# Patient Record
Sex: Female | Born: 1955 | Race: White | Hispanic: No | Marital: Married | Smoking: Never smoker
Health system: Southern US, Community
[De-identification: ages and names within clinical notes are randomized; demographics above are authoritative.]

## PROBLEM LIST (undated history)

## (undated) DIAGNOSIS — E7800 Pure hypercholesterolemia, unspecified: Secondary | ICD-10-CM

## (undated) DIAGNOSIS — R102 Pelvic and perineal pain unspecified side: Secondary | ICD-10-CM

## (undated) DIAGNOSIS — Q513 Bicornate uterus: Secondary | ICD-10-CM

## (undated) DIAGNOSIS — K219 Gastro-esophageal reflux disease without esophagitis: Secondary | ICD-10-CM

## (undated) DIAGNOSIS — T1491XA Suicide attempt, initial encounter: Secondary | ICD-10-CM

## (undated) HISTORY — DX: Suicide attempt, initial encounter: T14.91XA

## (undated) HISTORY — DX: Pelvic and perineal pain unspecified side: R10.20

## (undated) HISTORY — DX: Bicornate uterus: Q51.3

## (undated) HISTORY — DX: Gastro-esophageal reflux disease without esophagitis: K21.9

## (undated) HISTORY — DX: Pure hypercholesterolemia, unspecified: E78.00

## (undated) HISTORY — DX: Pelvic and perineal pain: R10.2

---

## 2000-08-30 ENCOUNTER — Other Ambulatory Visit: Admission: RE | Admit: 2000-08-30 | Discharge: 2000-08-30 | Payer: Self-pay | Admitting: Obstetrics and Gynecology

## 2001-01-01 ENCOUNTER — Inpatient Hospital Stay (HOSPITAL_COMMUNITY): Admission: AD | Admit: 2001-01-01 | Discharge: 2001-01-01 | Payer: Self-pay | Admitting: Obstetrics and Gynecology

## 2001-01-02 ENCOUNTER — Inpatient Hospital Stay (HOSPITAL_COMMUNITY): Admission: AD | Admit: 2001-01-02 | Discharge: 2001-01-02 | Payer: Self-pay | Admitting: Obstetrics and Gynecology

## 2001-01-21 ENCOUNTER — Encounter (INDEPENDENT_AMBULATORY_CARE_PROVIDER_SITE_OTHER): Payer: Self-pay | Admitting: Specialist

## 2001-01-21 ENCOUNTER — Inpatient Hospital Stay (HOSPITAL_COMMUNITY): Admission: AD | Admit: 2001-01-21 | Discharge: 2001-01-25 | Payer: Self-pay | Admitting: Obstetrics and Gynecology

## 2001-01-21 ENCOUNTER — Encounter: Payer: Self-pay | Admitting: Obstetrics and Gynecology

## 2001-01-26 ENCOUNTER — Encounter: Admission: RE | Admit: 2001-01-26 | Discharge: 2001-02-25 | Payer: Self-pay | Admitting: Obstetrics and Gynecology

## 2001-03-08 ENCOUNTER — Other Ambulatory Visit: Admission: RE | Admit: 2001-03-08 | Discharge: 2001-03-08 | Payer: Self-pay | Admitting: Obstetrics and Gynecology

## 2002-03-13 ENCOUNTER — Other Ambulatory Visit: Admission: RE | Admit: 2002-03-13 | Discharge: 2002-03-13 | Payer: Self-pay | Admitting: Obstetrics and Gynecology

## 2003-02-21 ENCOUNTER — Other Ambulatory Visit: Admission: RE | Admit: 2003-02-21 | Discharge: 2003-02-21 | Payer: Self-pay | Admitting: Obstetrics and Gynecology

## 2004-09-29 ENCOUNTER — Encounter (INDEPENDENT_AMBULATORY_CARE_PROVIDER_SITE_OTHER): Payer: Self-pay | Admitting: Specialist

## 2004-09-29 ENCOUNTER — Ambulatory Visit (HOSPITAL_COMMUNITY): Admission: RE | Admit: 2004-09-29 | Discharge: 2004-09-29 | Payer: Self-pay | Admitting: Gastroenterology

## 2005-10-26 ENCOUNTER — Other Ambulatory Visit: Admission: RE | Admit: 2005-10-26 | Discharge: 2005-10-26 | Payer: Self-pay | Admitting: Obstetrics and Gynecology

## 2008-01-20 ENCOUNTER — Inpatient Hospital Stay (HOSPITAL_COMMUNITY): Admission: EM | Admit: 2008-01-20 | Discharge: 2008-01-22 | Payer: Self-pay | Admitting: Emergency Medicine

## 2008-02-01 ENCOUNTER — Ambulatory Visit (HOSPITAL_COMMUNITY): Admission: RE | Admit: 2008-02-01 | Discharge: 2008-02-01 | Payer: Self-pay | Admitting: Orthopedic Surgery

## 2008-04-01 ENCOUNTER — Encounter: Admission: RE | Admit: 2008-04-01 | Discharge: 2008-04-01 | Payer: Self-pay | Admitting: Orthopedic Surgery

## 2008-05-21 ENCOUNTER — Ambulatory Visit: Payer: Self-pay | Admitting: Internal Medicine
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
## 2010-10-05 NOTE — Procedures (Signed)
[facility]                              EXERCISE TREADMILL   NAME:Holden, Jackie Lee                    MRN:          [id number]  DATE:05/21/2008                            DOB:          10-24-1955    INTRODUCTION:  The patient is a 55 year old with a history of several  cardiac risk factors and atypical chest pain, who is referred for  evaluation.  She is now referred for exercise treadmill testing.   PROCEDURE:  After informed consent was obtained, the patient was prepped  in the usual manner.  She subsequently walked on a Bruce protocol for a  total of 9 minutes.  Her baseline ECG was normal.  She reached a peak  heart rate of 164 beats per minute.  Her peak blood pressure reached  211/80.  The test was stopped secondary to fatigue and MD discretion.  She had no chest pain or shortness of breath.  She had her EKG segments  demonstrating no evidence of inducible ischemia with no ST-T wave  changes and no inducible arrhythmias.  She was allowed to recover in the  usual manner with normalization of her blood pressure.   COMPLICATIONS:  There were no immediate procedure complications.   RESULTS:  Clinically and electrically negative exercise treadmill test  in a patient with atypical chest pain and several cardiac risk factors  for coronary artery disease.     Doylene Canning. Ladona Ridgel, MD  Electronically Signed    GWT/MedQ  DD: 05/21/2008  DT: 05/22/2008  Job #: [id number]   cc:   Payton Spark
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
## 2010-10-08 NOTE — Op Note (Signed)
NAMEANTONIQUE, Jackie Lee             ACCOUNT NO.:  [id number]   MEDICAL RECORD NO.:  [id number]          PATIENT TYPE:  AMB   LOCATION:  ENDO                         FACILITY:  [facility]   PHYSICIAN:  Anselmo Rod, M.D.  DATE OF BIRTH:  [birth year]   DATE OF PROCEDURE:  09/29/2004  DATE OF DISCHARGE:                                 OPERATIVE REPORT   PROCEDURE PERFORMED:  Esophagogastroduodenoscopy.   ENDOSCOPIST:  Anselmo Rod, M.D.   INSTRUMENT USED:  Olympus video panendoscope.   INDICATIONS FOR PROCEDURE:  A 55 year old white female with a history of  epigastric pain and black stool undergoing EGD to rule out peptic ulcer  disease, esophagitis, gastritis, etc.   PREPROCEDURE PREPARATION:  Informed consent was procured from the patient.  The patient fasted for eight hours prior to the procedure.   PREPROCEDURE PHYSICAL EXAMINATION:  VITAL SIGNS:  Stable.  NECK:  Supple.  CHEST:  Clear to auscultation.  CARDIOVASCULAR:  S1 and S2 regular.  ABDOMEN:  Soft with normal bowel sounds.   DESCRIPTION OF PROCEDURE:  The patient was placed in left lateral decubitus  position, sedated with 50 mg of Demerol and 5  mg of Versed in slow  incremental doses.  Once the patient was adequately sedated and maintained  on low flow oxygen and continuous cardiac monitoring, the Olympus video  panendoscope was advanced through the mouthpiece over the tongue and into  the esophagus under direct vision.  Mild distal esophagitis was noted.  No  ulcers, erosions, masses or polyps were identified.  The entire gastric  mucosa appeared healthy and so did the proximal small-bowel.  Retroflexion  of the high cardia revealed no abnormalities.  The patient tolerated the  procedure well without complications.  The proximal esophagus appeared  normal as well.   IMPRESSION:  Normal EGD except for mild distal esophagitis.   RECOMMENDATIONS:  1.  Continue PPIs.  2.  Avoid all nonsteroidals.  3.  Proceed with  a colonoscopy at this time.  Further recommendations will      be made after the colonoscopy has been done.      JNM/MEDQ  D:  09/30/2004  T:  09/30/2004  Job:  [id number]   cc:   Gabriel Earing, M.D.  [address]  [place]  [place] [address]  Fax: [phone]

## 2010-10-08 NOTE — Op Note (Signed)
[name], Jackie Lee             ACCOUNT NO.:  [id number]   MEDICAL RECORD NO.:  [id number]          PATIENT TYPE:  AMB   LOCATION:  ENDO                         FACILITY:  [facility]   PHYSICIAN:  Anselmo Rod, M.D.  DATE OF BIRTH:  June 22, 1955   DATE OF PROCEDURE:  09/30/2004  DATE OF DISCHARGE:                                 OPERATIVE REPORT   PROCEDURE PERFORMED:  Colonoscopy with cold biopsies times four.   ENDOSCOPIST:  Charna Elizabeth, M.D.   INSTRUMENT USED:  Olympus video colonoscope.   INDICATIONS FOR PROCEDURE:  The patient is a 55 year old white female  undergoing screening colonoscopy.  The patient has a history of occasional  rectal bleeding and change in bowel habits.  She claims she has noticed some  black tarry stools.  Rule out colonic polyps, masses, etc.   PREPROCEDURE PREPARATION:  Informed consent was procured from the patient.  The patient was fasted for eight hours prior to the procedure and prepped  with a bottle of magnesium citrate and a gallon of GoLYTELY the night prior  to the procedure.  The risks and benefits of the procedure including a 10%  miss rate for polyps or cancers was discussed with the patient as well.   PREPROCEDURE PHYSICAL:  The patient had stable vital signs.  Neck supple.  Chest clear to auscultation.  S1 and S2 regular.  Abdomen soft with normal  bowel sounds.   DESCRIPTION OF PROCEDURE:  The patient was placed in left lateral decubitus  position and sedated with an additional 2.5 mg of Demerol in slow  incremental doses.  Once the patient was adequately sedated and maintained  on low flow oxygen and continuous cardiac monitoring, the Olympus video  colonoscope was advanced from the rectum to the cecum.  There was a  significant amount of residual stool in the colon and multiple washes were  done.  A few sigmoid diverticula were noted.  Internal hemorrhoids were seen  on retroflexion. A small polyp was biopsied from 70 cm (cold  biopsies times  four).  The rest of the exam was unremarkable.  Multiple washes were done.  Small lesions could have been missed.  The patient tolerated the procedure  well without complication.  The procedure was complete up to the cecum.   IMPRESSION:  1.  Small sessile polyp was biopsied from 70 cm.  2.  Few sigmoid diverticula.  3.  Internal hemorrhoids.   RECOMMENDATIONS:  1.  Await pathology results.  2.  Avoid all nonsteroidals including aspirin for the next two weeks.  3.  Outpatient followup in the next two weeks for further recommendations.      JNM/MEDQ  D:  09/30/2004  T:  09/30/2004  Job:  [id number]   cc:   Gabriel Earing, M.D.  [address]  [place]  [place] [address]  Fax: [phone]

## 2011-10-05 DIAGNOSIS — E7800 Pure hypercholesterolemia, unspecified: Secondary | ICD-10-CM | POA: Insufficient documentation

## 2011-10-05 DIAGNOSIS — T1491XA Suicide attempt, initial encounter: Secondary | ICD-10-CM | POA: Insufficient documentation

## 2011-10-05 DIAGNOSIS — R102 Pelvic and perineal pain: Secondary | ICD-10-CM | POA: Insufficient documentation

## 2011-10-05 DIAGNOSIS — Q513 Bicornate uterus: Secondary | ICD-10-CM | POA: Insufficient documentation

## 2011-10-14 ENCOUNTER — Ambulatory Visit: Payer: Self-pay | Admitting: Obstetrics and Gynecology

## 2011-11-22 ENCOUNTER — Other Ambulatory Visit (HOSPITAL_COMMUNITY)
Admission: RE | Admit: 2011-11-22 | Discharge: 2011-11-22 | Disposition: A | Discharge status: Home / Self Care | Payer: 59 | Source: Ambulatory Visit | Attending: Family Medicine | Admitting: Family Medicine

## 2011-11-22 ENCOUNTER — Other Ambulatory Visit: Payer: Self-pay | Admitting: Family Medicine

## 2011-11-22 DIAGNOSIS — Z124 Encounter for screening for malignant neoplasm of cervix: Secondary | ICD-10-CM | POA: Insufficient documentation

## 2013-12-02 ENCOUNTER — Other Ambulatory Visit: Payer: Self-pay | Admitting: Family Medicine

## 2013-12-02 DIAGNOSIS — M7989 Other specified soft tissue disorders: Secondary | ICD-10-CM

## 2013-12-04 ENCOUNTER — Ambulatory Visit
Admission: RE | Admit: 2013-12-04 | Discharge: 2013-12-04 | Disposition: A | Discharge status: Home / Self Care | Payer: 59 | Source: Ambulatory Visit | Attending: Family Medicine | Admitting: Family Medicine

## 2013-12-04 DIAGNOSIS — M7989 Other specified soft tissue disorders: Secondary | ICD-10-CM

## 2013-12-04 MED ORDER — IOHEXOL 300 MG/ML  SOLN
100.0000 mL | Freq: Once | INTRAMUSCULAR | Status: AC | PRN
  Administered 2013-12-04: 100 mL via INTRAVENOUS

## 2013-12-30 ENCOUNTER — Ambulatory Visit (INDEPENDENT_AMBULATORY_CARE_PROVIDER_SITE_OTHER): Payer: 59 | Admitting: Surgery

## 2014-01-31 ENCOUNTER — Ambulatory Visit (INDEPENDENT_AMBULATORY_CARE_PROVIDER_SITE_OTHER): Payer: 59 | Admitting: Surgery

## 2014-12-04 ENCOUNTER — Other Ambulatory Visit (HOSPITAL_COMMUNITY)
Admission: RE | Admit: 2014-12-04 | Discharge: 2014-12-04 | Disposition: A | Discharge status: Home / Self Care | Payer: 59 | Source: Ambulatory Visit | Attending: Family Medicine | Admitting: Family Medicine

## 2014-12-04 ENCOUNTER — Other Ambulatory Visit: Payer: Self-pay | Admitting: Family Medicine

## 2014-12-04 DIAGNOSIS — Z124 Encounter for screening for malignant neoplasm of cervix: Secondary | ICD-10-CM | POA: Diagnosis present

## 2014-12-08 LAB — CYTOLOGY - PAP

## 2015-03-17 IMAGING — CT CT ABD-PELV W/ CM
2 of 5 series · 17 of 46 positions shown, 19 images · IV contrast (READICAT/WATER & [ID] OMNI 300)
Comparison: None.

CLINICAL DATA: Soft tissue mass in the left abdominal wall

EXAM:
CT ABDOMEN AND PELVIS WITH CONTRAST
TECHNIQUE: Multidetector CT imaging of the abdomen and pelvis was performed
using the standard protocol following bolus administration of
intravenous contrast.
CONTRAST:  100mL OMNIPAQUE IOHEXOL 300 MG/ML  SOLN

[Series 100: abd/pelvis with · axial · 0.70mm/px · z∈[-347,+33]mm · 14 of 86 slices shown, 16 images]
[im 5/86  soft-tissue]
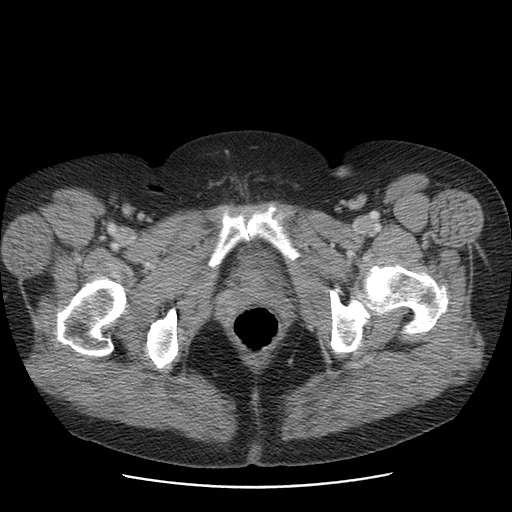
[im 5/86  bone]
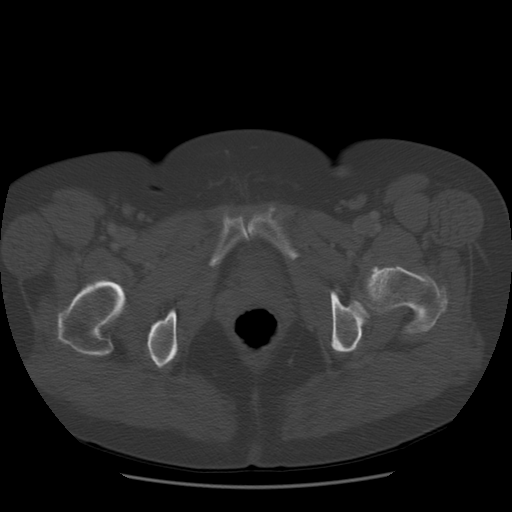
[im 9/86  soft-tissue]
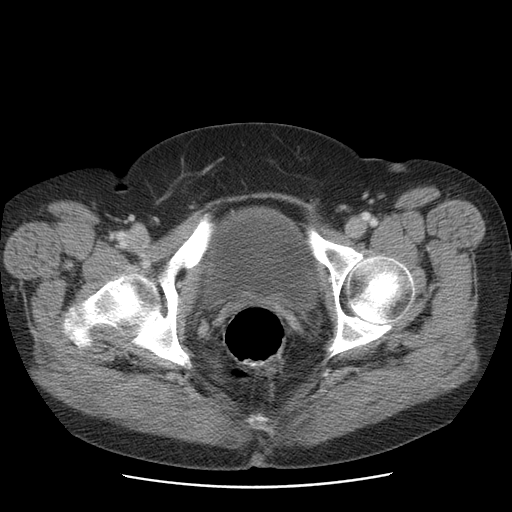
[im 18/86  soft-tissue]
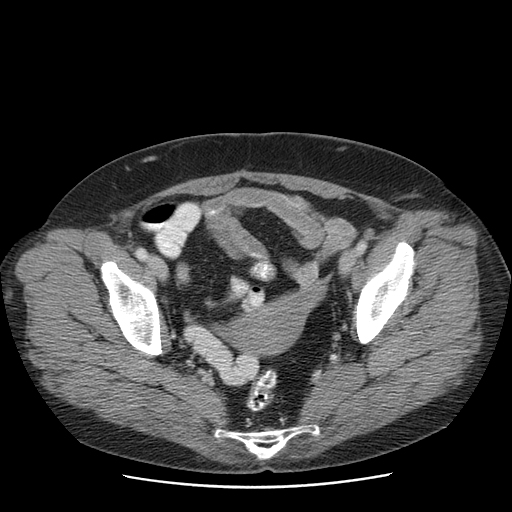
[im 23/86  soft-tissue]
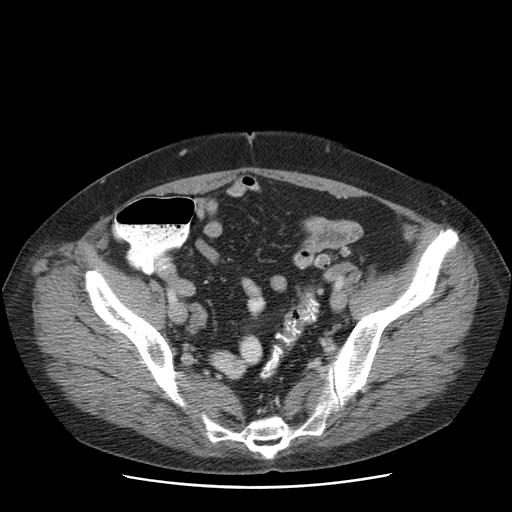
[im 27/86  soft-tissue]
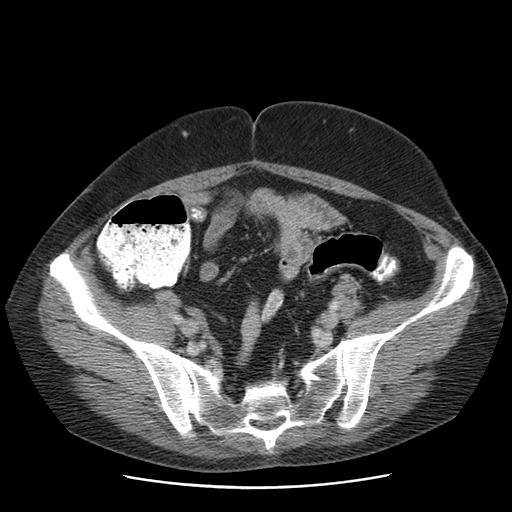
[im 36/86  soft-tissue]
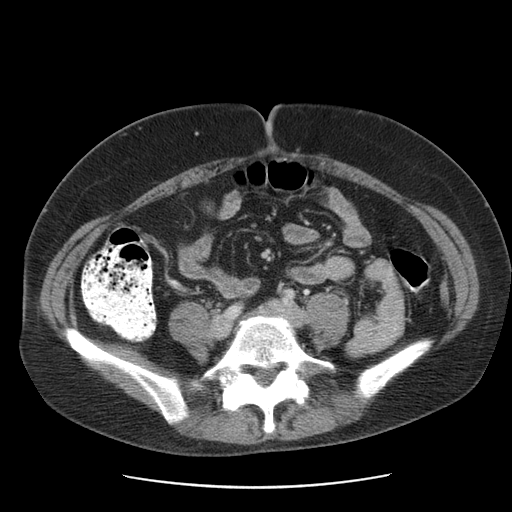
[im 41/86  soft-tissue]
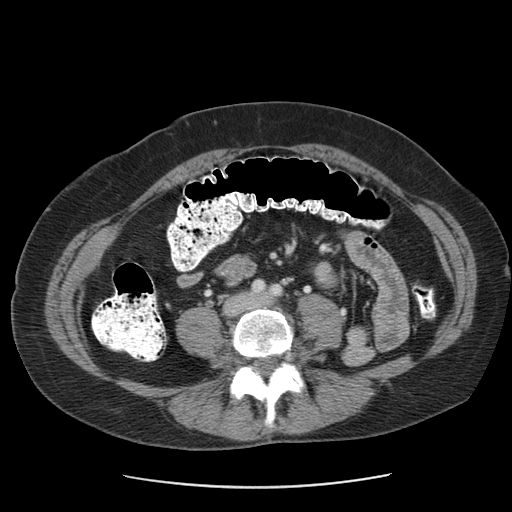
[im 45/86  soft-tissue]
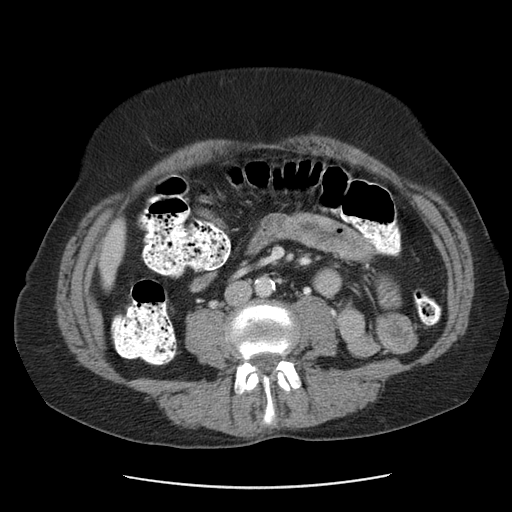
[im 50/86  soft-tissue]
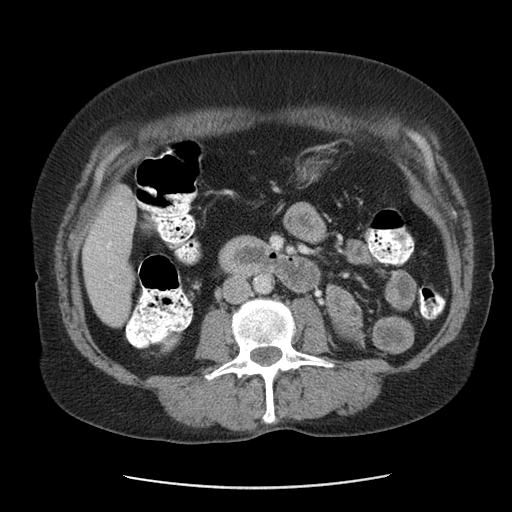
[im 50/86  bone]
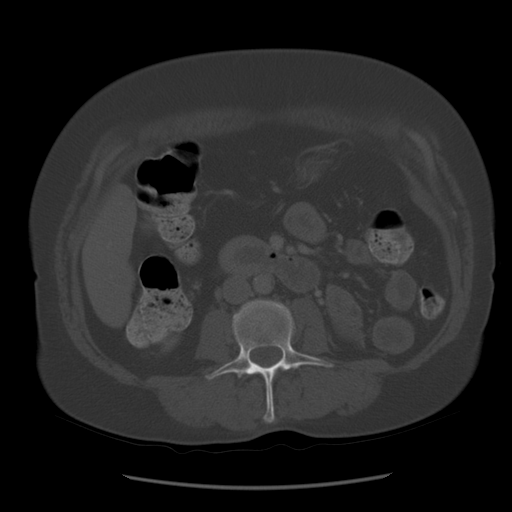
[im 59/86  soft-tissue]
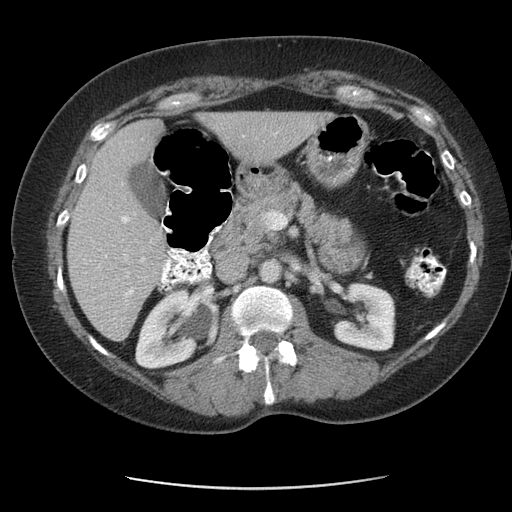
[im 63/86  soft-tissue]
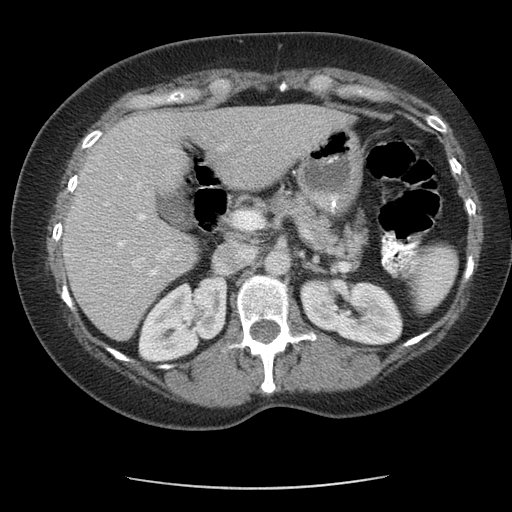
[im 68/86  soft-tissue]
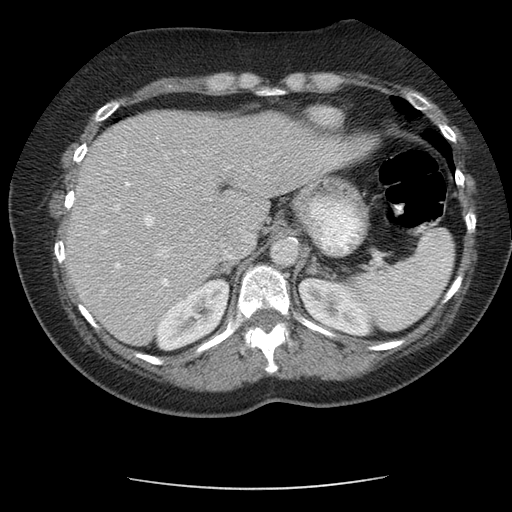
[im 77/86  soft-tissue]
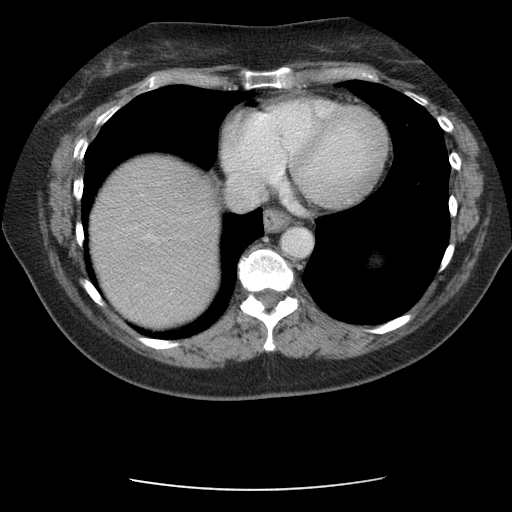
[im 81/86  soft-tissue]
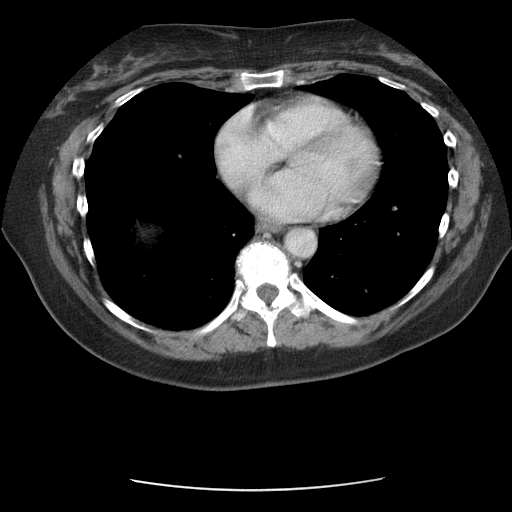

[Series 103: cor · coronal · 0.98mm/px · 3 of 147 slices shown]
[im 49/147  soft-tissue]
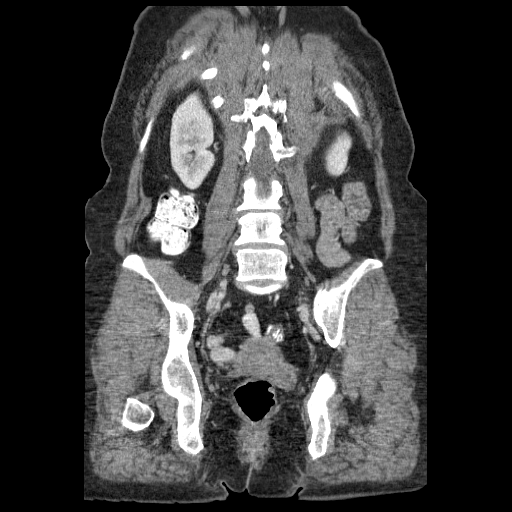
[im 65/147  soft-tissue]
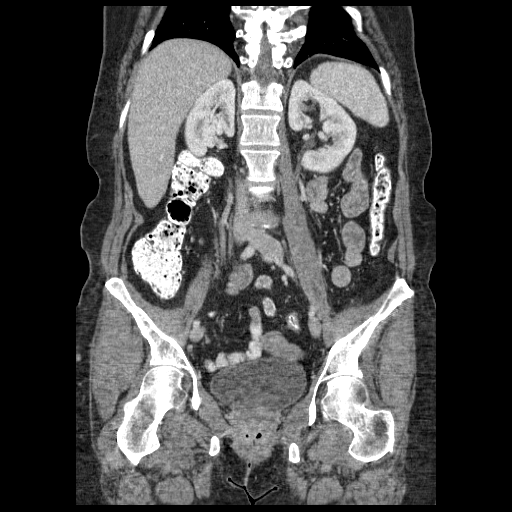
[im 82/147  soft-tissue]
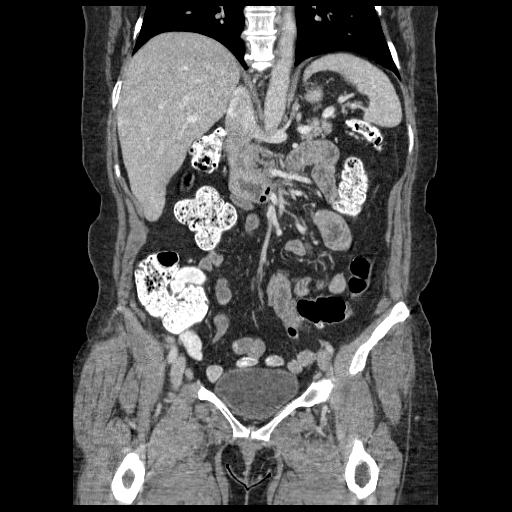

[17 of 46 positions shown; findings below may reference images not displayed]

FINDINGS: The lung bases are free of acute infiltrate or sizable effusion.

The liver, gallbladder, spleen, adrenal glands and pancreas are
within normal limits. The kidneys are well visualized bilaterally
and demonstrate no renal calculi or obstructive changes. A prominent
right extrarenal pelvis is noted.

The appendix is well visualized and within normal limits. Scattered
diverticular change of the colon is seen without diverticulitis. The
bladder is partially distended. No pelvic mass lesion or sidewall
abnormality is seen. In the area of clinical concern, mild
prominence of the subcutaneous fat is noted consistent with a
lipoma. No abnormal soft tissue density is identified. The
subcutaneous adipose tissue is otherwise within normal limits. The
osseous structures are within normal limits for the patient's given
age.
IMPRESSION: Changes consistent with a subcutaneous lipoma in the lateral
abdominal wall in the area of clinical concern. No soft tissue
component is identified in this is consistent with a simple lipoma.

Chronic changes without acute abnormality.

## 2016-01-18 ENCOUNTER — Other Ambulatory Visit: Payer: Self-pay | Admitting: Surgery

## 2016-01-18 DIAGNOSIS — Z22322 Carrier or suspected carrier of Methicillin resistant Staphylococcus aureus: Secondary | ICD-10-CM

## 2016-09-02 DIAGNOSIS — E782 Mixed hyperlipidemia: Secondary | ICD-10-CM | POA: Diagnosis not present

## 2016-09-02 DIAGNOSIS — R7303 Prediabetes: Secondary | ICD-10-CM | POA: Diagnosis not present

## 2016-09-02 DIAGNOSIS — E559 Vitamin D deficiency, unspecified: Secondary | ICD-10-CM | POA: Diagnosis not present

## 2016-09-02 DIAGNOSIS — R5383 Other fatigue: Secondary | ICD-10-CM | POA: Diagnosis not present

## 2016-09-06 DIAGNOSIS — M8589 Other specified disorders of bone density and structure, multiple sites: Secondary | ICD-10-CM | POA: Diagnosis not present

## 2016-09-06 DIAGNOSIS — Z803 Family history of malignant neoplasm of breast: Secondary | ICD-10-CM | POA: Diagnosis not present

## 2016-09-06 DIAGNOSIS — Z1231 Encounter for screening mammogram for malignant neoplasm of breast: Secondary | ICD-10-CM | POA: Diagnosis not present

## 2016-09-12 DIAGNOSIS — L308 Other specified dermatitis: Secondary | ICD-10-CM | POA: Diagnosis not present

## 2016-09-12 DIAGNOSIS — L738 Other specified follicular disorders: Secondary | ICD-10-CM | POA: Diagnosis not present

## 2016-09-20 DIAGNOSIS — E782 Mixed hyperlipidemia: Secondary | ICD-10-CM | POA: Diagnosis not present

## 2016-09-20 DIAGNOSIS — R7303 Prediabetes: Secondary | ICD-10-CM | POA: Diagnosis not present

## 2016-09-20 DIAGNOSIS — E559 Vitamin D deficiency, unspecified: Secondary | ICD-10-CM | POA: Diagnosis not present

## 2016-11-07 DIAGNOSIS — E559 Vitamin D deficiency, unspecified: Secondary | ICD-10-CM | POA: Diagnosis not present

## 2016-11-08 DIAGNOSIS — R7303 Prediabetes: Secondary | ICD-10-CM | POA: Diagnosis not present

## 2016-11-08 DIAGNOSIS — E559 Vitamin D deficiency, unspecified: Secondary | ICD-10-CM | POA: Diagnosis not present

## 2016-11-08 DIAGNOSIS — E782 Mixed hyperlipidemia: Secondary | ICD-10-CM | POA: Diagnosis not present

## 2016-12-06 DIAGNOSIS — E559 Vitamin D deficiency, unspecified: Secondary | ICD-10-CM | POA: Diagnosis not present

## 2016-12-06 DIAGNOSIS — R7303 Prediabetes: Secondary | ICD-10-CM | POA: Diagnosis not present

## 2016-12-06 DIAGNOSIS — E782 Mixed hyperlipidemia: Secondary | ICD-10-CM | POA: Diagnosis not present

## 2016-12-09 DIAGNOSIS — Z0000 Encounter for general adult medical examination without abnormal findings: Secondary | ICD-10-CM | POA: Diagnosis not present

## 2016-12-30 DIAGNOSIS — H40013 Open angle with borderline findings, low risk, bilateral: Secondary | ICD-10-CM | POA: Diagnosis not present

## 2017-01-06 DIAGNOSIS — Z91038 Other insect allergy status: Secondary | ICD-10-CM | POA: Diagnosis not present

## 2017-01-06 DIAGNOSIS — N390 Urinary tract infection, site not specified: Secondary | ICD-10-CM | POA: Diagnosis not present

## 2017-01-06 DIAGNOSIS — R319 Hematuria, unspecified: Secondary | ICD-10-CM | POA: Diagnosis not present

## 2017-04-04 DIAGNOSIS — E782 Mixed hyperlipidemia: Secondary | ICD-10-CM | POA: Diagnosis not present

## 2017-04-04 DIAGNOSIS — R7303 Prediabetes: Secondary | ICD-10-CM | POA: Diagnosis not present

## 2017-04-04 DIAGNOSIS — E559 Vitamin D deficiency, unspecified: Secondary | ICD-10-CM | POA: Diagnosis not present

## 2017-04-06 DIAGNOSIS — R7303 Prediabetes: Secondary | ICD-10-CM | POA: Diagnosis not present

## 2017-04-06 DIAGNOSIS — E782 Mixed hyperlipidemia: Secondary | ICD-10-CM | POA: Diagnosis not present

## 2017-04-06 DIAGNOSIS — E559 Vitamin D deficiency, unspecified: Secondary | ICD-10-CM | POA: Diagnosis not present

## 2017-04-10 DIAGNOSIS — L218 Other seborrheic dermatitis: Secondary | ICD-10-CM | POA: Diagnosis not present

## 2017-04-10 DIAGNOSIS — D1801 Hemangioma of skin and subcutaneous tissue: Secondary | ICD-10-CM | POA: Diagnosis not present

## 2017-06-30 ENCOUNTER — Other Ambulatory Visit: Payer: Self-pay | Admitting: Family Medicine

## 2017-06-30 ENCOUNTER — Ambulatory Visit
Admission: RE | Admit: 2017-06-30 | Discharge: 2017-06-30 | Disposition: A | Discharge status: Home / Self Care | Payer: 59 | Source: Ambulatory Visit | Attending: Family Medicine | Admitting: Family Medicine

## 2017-06-30 DIAGNOSIS — R221 Localized swelling, mass and lump, neck: Secondary | ICD-10-CM

## 2017-07-04 ENCOUNTER — Other Ambulatory Visit: Payer: Self-pay | Admitting: Endocrinology

## 2017-07-04 DIAGNOSIS — E041 Nontoxic single thyroid nodule: Secondary | ICD-10-CM

## 2017-07-05 ENCOUNTER — Other Ambulatory Visit: Payer: Self-pay | Admitting: Endocrinology

## 2017-07-05 ENCOUNTER — Ambulatory Visit
Admission: RE | Admit: 2017-07-05 | Discharge: 2017-07-05 | Disposition: A | Discharge status: Home / Self Care | Payer: 59 | Source: Ambulatory Visit | Attending: Endocrinology | Admitting: Endocrinology

## 2017-07-05 ENCOUNTER — Other Ambulatory Visit (HOSPITAL_COMMUNITY)
Admission: RE | Admit: 2017-07-05 | Discharge: 2017-07-05 | Disposition: A | Discharge status: Home / Self Care | Payer: 59 | Source: Ambulatory Visit | Attending: General Surgery | Admitting: General Surgery

## 2017-07-05 DIAGNOSIS — E041 Nontoxic single thyroid nodule: Secondary | ICD-10-CM | POA: Insufficient documentation

## 2017-12-21 ENCOUNTER — Other Ambulatory Visit: Payer: Self-pay

## 2017-12-21 ENCOUNTER — Other Ambulatory Visit (HOSPITAL_COMMUNITY)
Admission: RE | Admit: 2017-12-21 | Discharge: 2017-12-21 | Disposition: A | Discharge status: Home / Self Care | Payer: 59 | Source: Ambulatory Visit | Attending: Family Medicine | Admitting: Family Medicine

## 2017-12-21 DIAGNOSIS — Z124 Encounter for screening for malignant neoplasm of cervix: Secondary | ICD-10-CM | POA: Insufficient documentation

## 2017-12-27 LAB — CYTOLOGY - PAP: Diagnosis: NEGATIVE

## 2018-03-07 ENCOUNTER — Other Ambulatory Visit: Payer: Self-pay | Admitting: Endocrinology

## 2018-03-07 DIAGNOSIS — E041 Nontoxic single thyroid nodule: Secondary | ICD-10-CM

## 2018-03-12 ENCOUNTER — Ambulatory Visit
Admission: RE | Admit: 2018-03-12 | Discharge: 2018-03-12 | Disposition: A | Discharge status: Home / Self Care | Payer: 59 | Source: Ambulatory Visit | Attending: Endocrinology | Admitting: Endocrinology

## 2018-03-12 DIAGNOSIS — E041 Nontoxic single thyroid nodule: Secondary | ICD-10-CM

## 2018-11-30 IMAGING — US US THYROID
1 series · 13 of 25 positions shown · non-contrast
Comparison: None.

CLINICAL DATA: Palpable abnormality.  Left-sided neck mass.

EXAM:
THYROID ULTRASOUND
TECHNIQUE: Ultrasound examination of the thyroid gland and adjacent soft
tissues was performed.

[Series 1: us thyroid · 0.10mm/px · 13 of 47 slices shown]
[im 1/47]
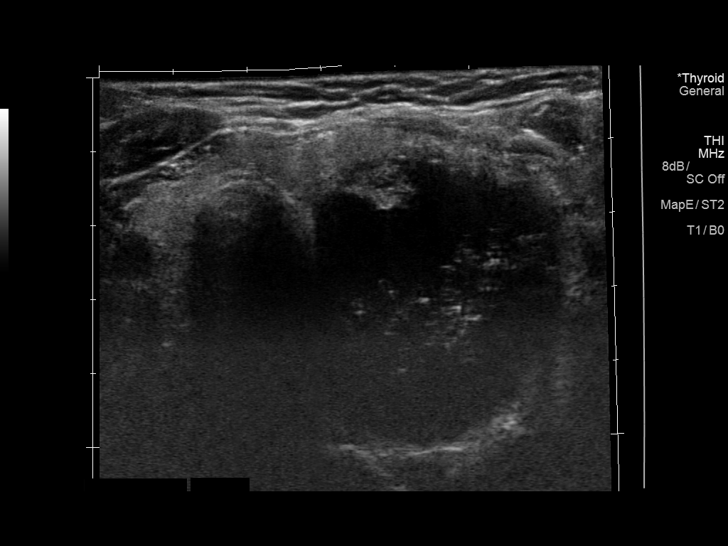
[im 4/47]
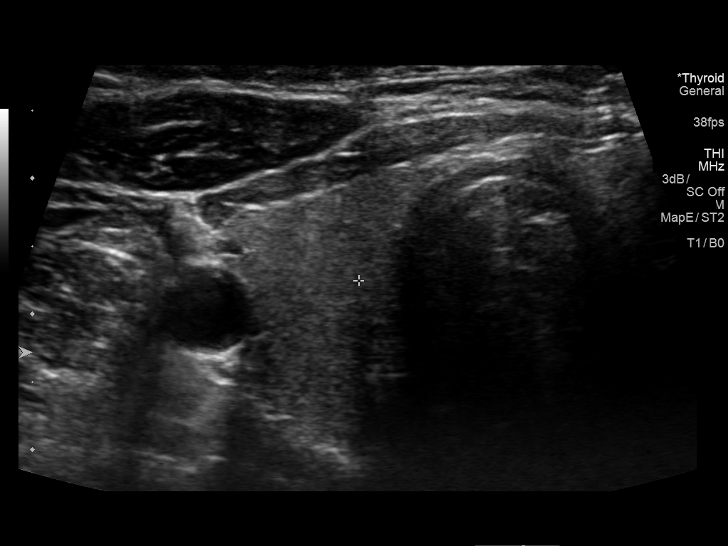
[im 8/47]
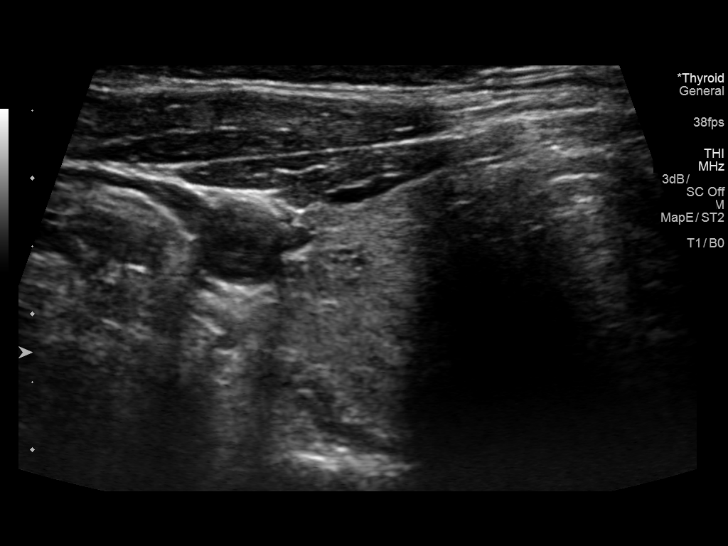
[im 12/47]
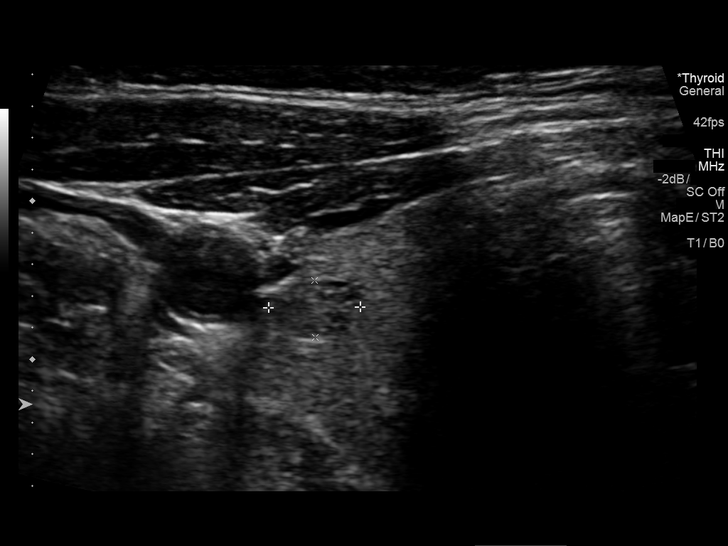
[im 16/47]
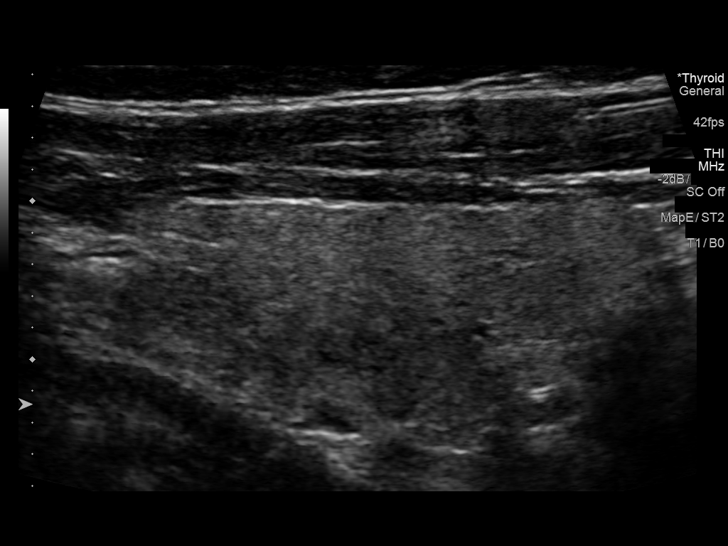
[im 20/47]
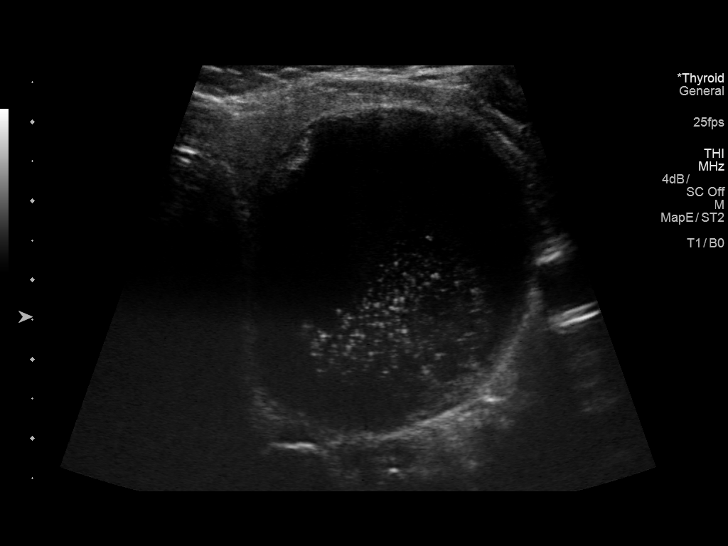
[im 24/47]
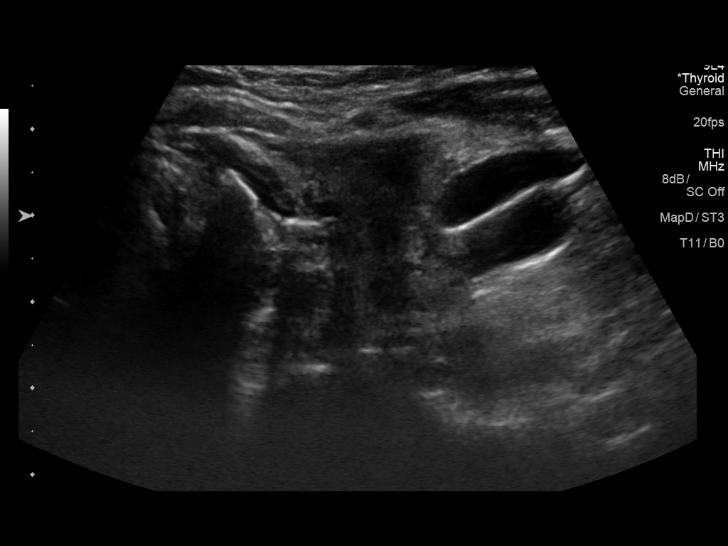
[im 27/47]
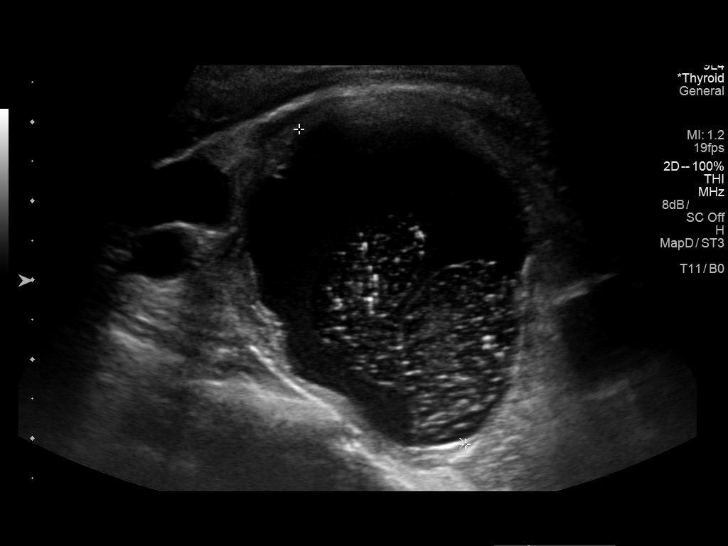
[im 31/47]
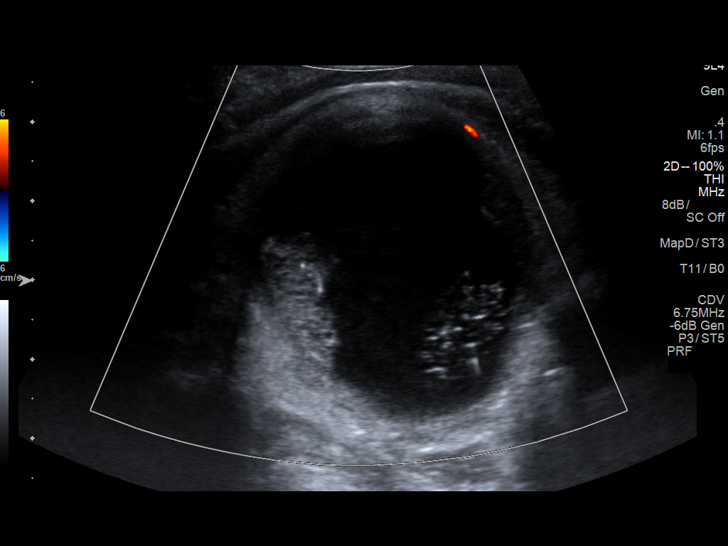
[im 35/47]
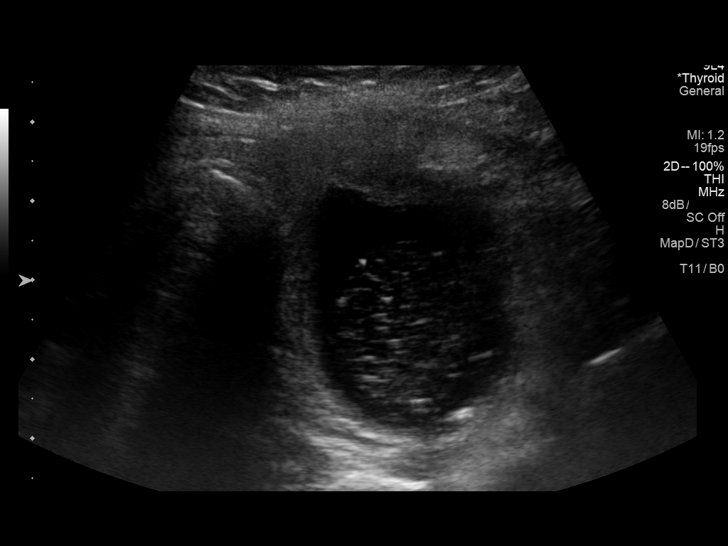
[im 39/47]
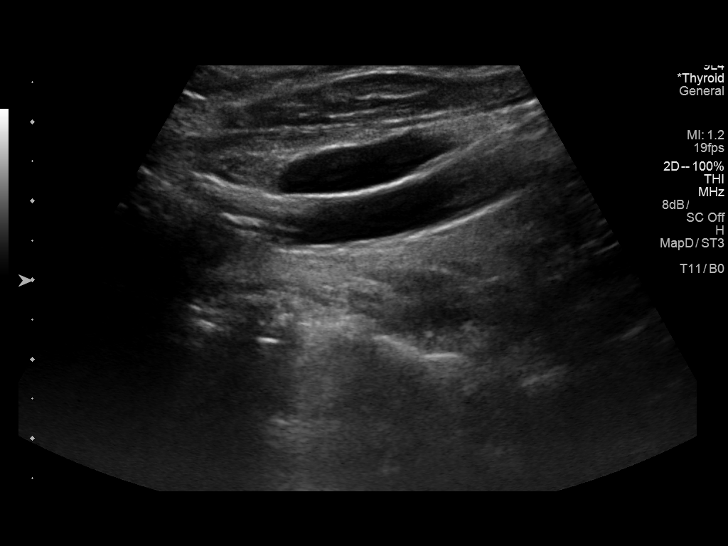
[im 43/47]
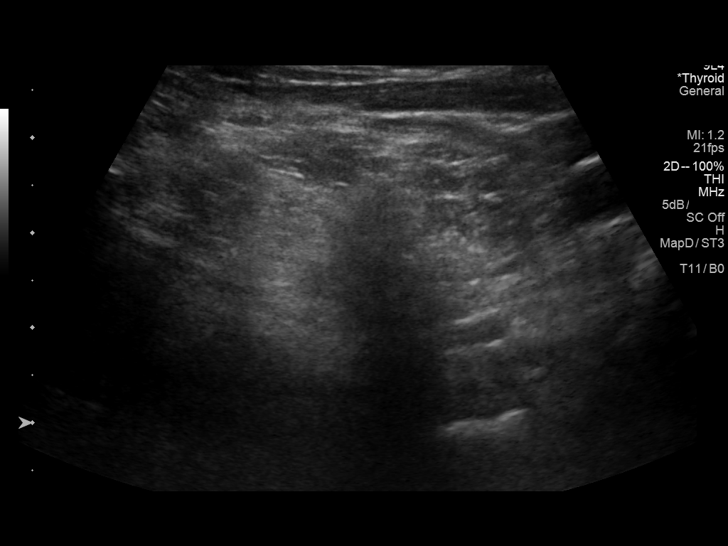
[im 47/47]
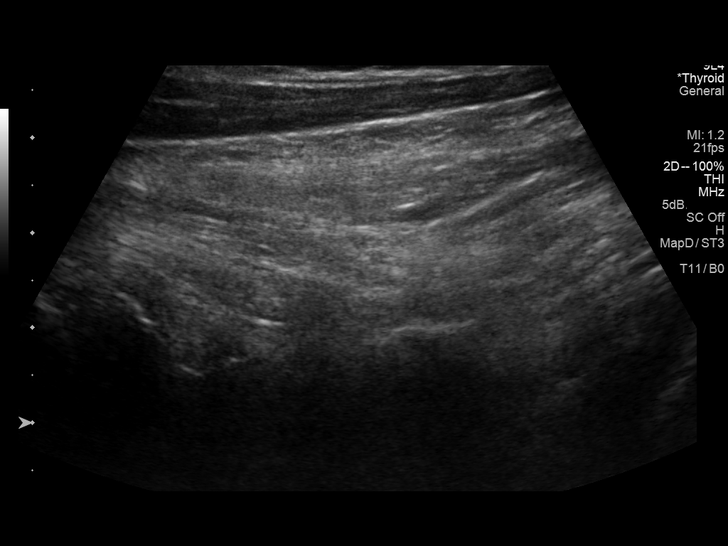

[13 of 25 positions shown; findings below may reference images not displayed]

FINDINGS: Parenchymal Echotexture: Mildly heterogenous

Isthmus: 0.4 cm

Right lobe: 4.2 x 1.8 x 1.2 cm

Left lobe: 5.3 x 4.6 x 4.0 cm

_________________________________________________________

Estimated total number of nodules >/= 1 cm: 1

Number of spongiform nodules >/=  2 cm not described below (TR1): 0

Number of mixed cystic and solid nodules >/= 1.5 cm not described
below (TR2): 0

_________________________________________________________

Nodule # 1:

Location: Right; Superior

Maximum size: 0.6 cm; Other 2 dimensions: 0.5 x 0.4 cm

Composition: solid/almost completely solid (2)

Echogenicity: isoechoic (1)

Shape: not taller-than-wide (0)

Margins: ill-defined (0)

Echogenic foci: punctate echogenic foci (3)

ACR TI-RADS total points: 6.

ACR TI-RADS risk category: TR4 (4-6 points).

ACR TI-RADS recommendations:

Given size (<0.9 cm) and appearance, this nodule does NOT meet
TI-RADS criteria for biopsy or dedicated follow-up.

_________________________________________________________

Nodule # 2:

Location: Left; Mid

Maximum size: 4.5 cm; Other 2 dimensions: 3.8 x 4.0 cm

Composition: mixed cystic and solid (1)

Echogenicity: hypoechoic (2)

Shape: taller-than-wide (3)

Margins: smooth (0)

Echogenic foci: punctate echogenic foci (3)

ACR TI-RADS total points: 9.

ACR TI-RADS risk category: TR5 (>/= 7 points).

ACR TI-RADS recommendations:

**Given size (>/= 1.0 cm) and appearance, fine needle aspiration of
this highly suspicious nodule should be considered based on TI-RADS
criteria.

_________________________________________________________

No abnormal appearing lymph nodes identified.
IMPRESSION: 1. 4.5 cm solid and cystic nodule in the left lobe with solid
portion demonstrating worrisome features including diffuse punctate
echogenic foci. Fine-needle aspiration is recommended.
2. 0.9 cm superior right thyroid nodule does not meet criteria for
fine-needle aspiration or dedicated follow-up.

The above is in keeping with the ACR TI-RADS recommendations - [HOSPITAL] 0927;[DATE].

## 2018-12-05 IMAGING — US US FNA BIOPSY THYROID 1ST LESION
1 series · 13 of 22 positions shown · non-contrast
Comparison: Ultrasound on July 01, 2017

MEDICATIONS:
1% lidocaine

COMPLICATIONS:
None immediate.

INDICATION: Indeterminate thyroid nodule

EXAM:
ULTRASOUND GUIDED FINE NEEDLE ASPIRATION OF INDETERMINATE THYROID
NODULE
TECHNIQUE: Informed written consent was obtained from the patient after a
discussion of the risks, benefits and alternatives to treatment.
Questions regarding the procedure were encouraged and answered. A
timeout was performed prior to the initiation of the procedure.

[Series 1: us fna biopsy thyroid 1st lesion · 0.09mm/px · 22 acquisitions, 13 frames shown]
[im 1/22]
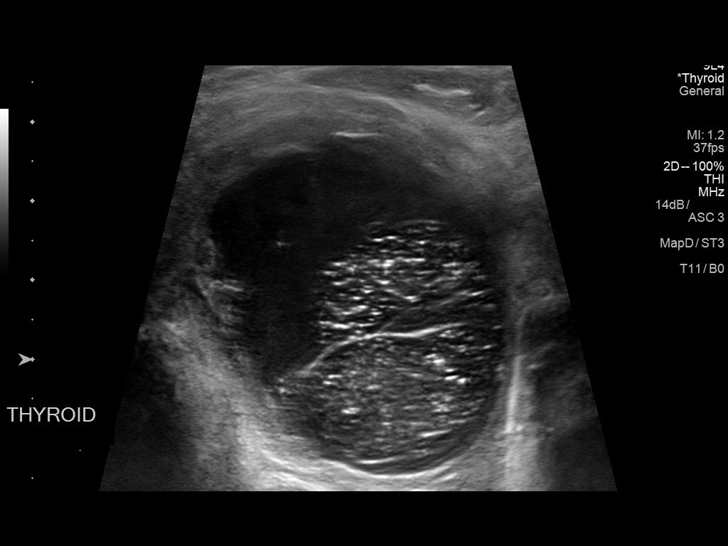
[im 3/22]
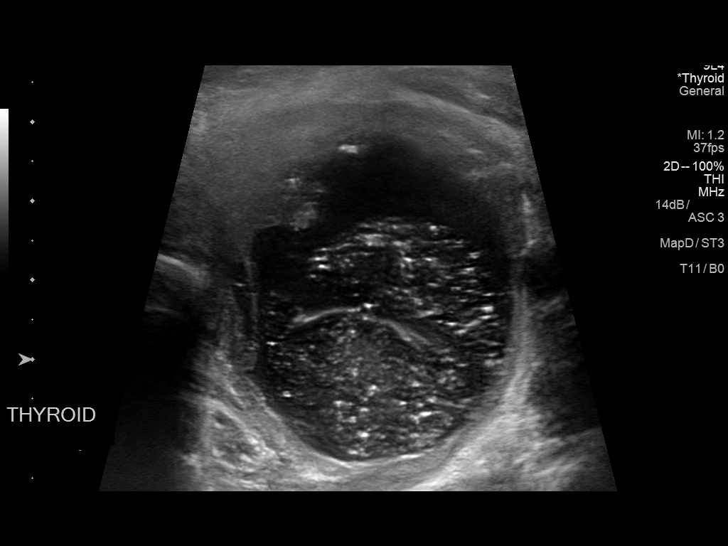
[im 5/22]
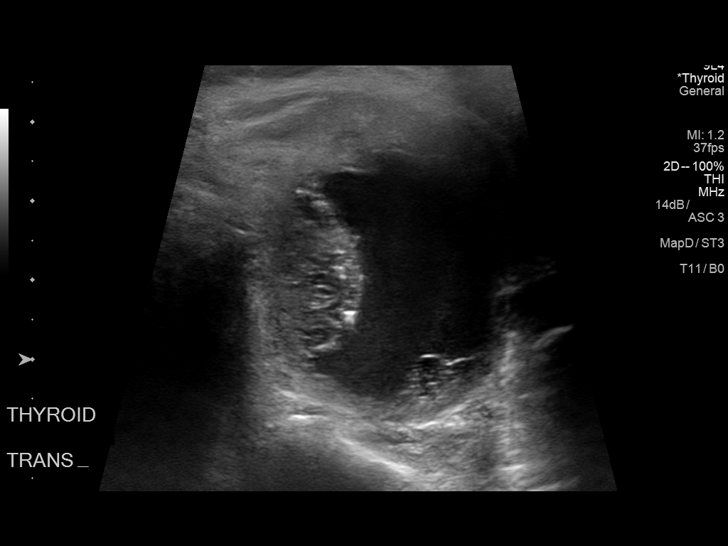
[im 6/22]
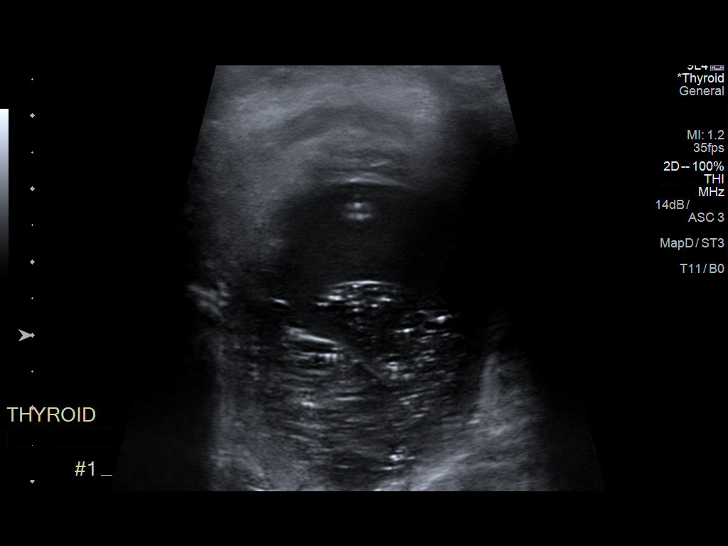
[im 8/22]
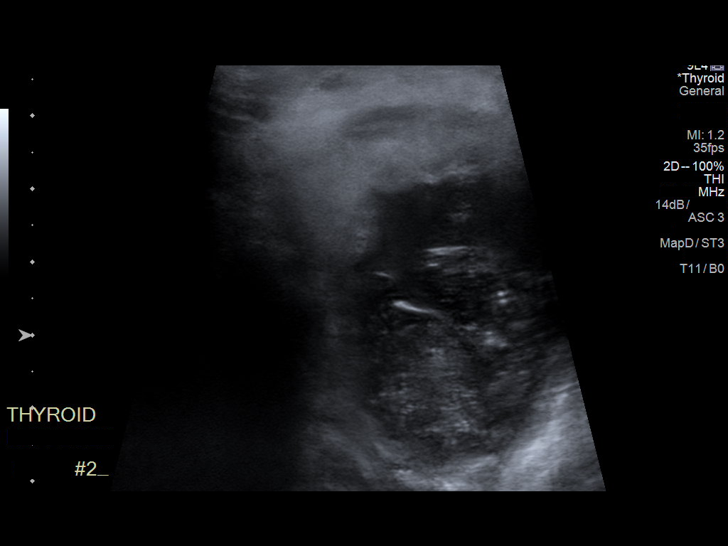
[im 10/22]
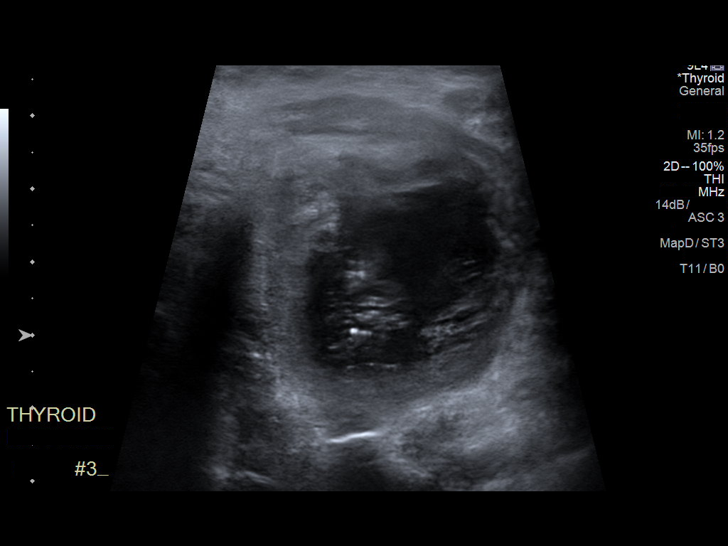
[im 12/22]
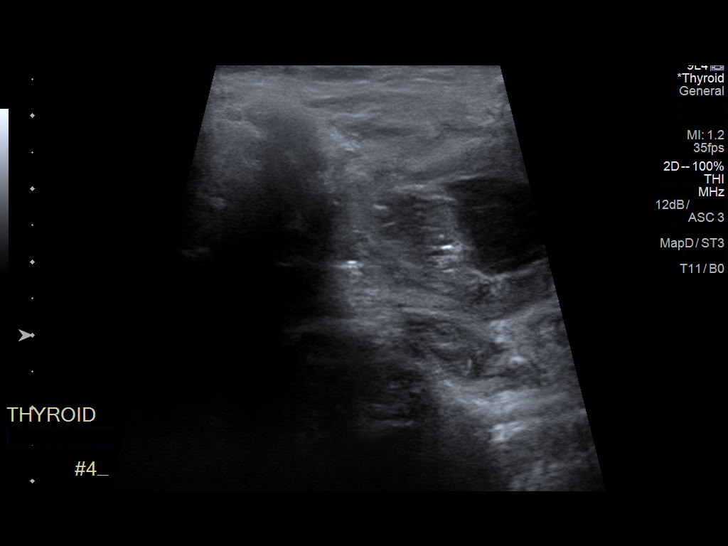
[im 13/22]
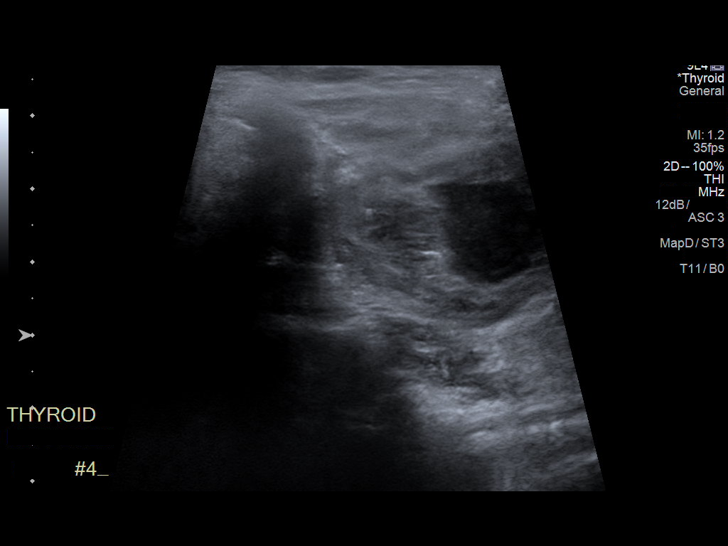
[im 15/22]
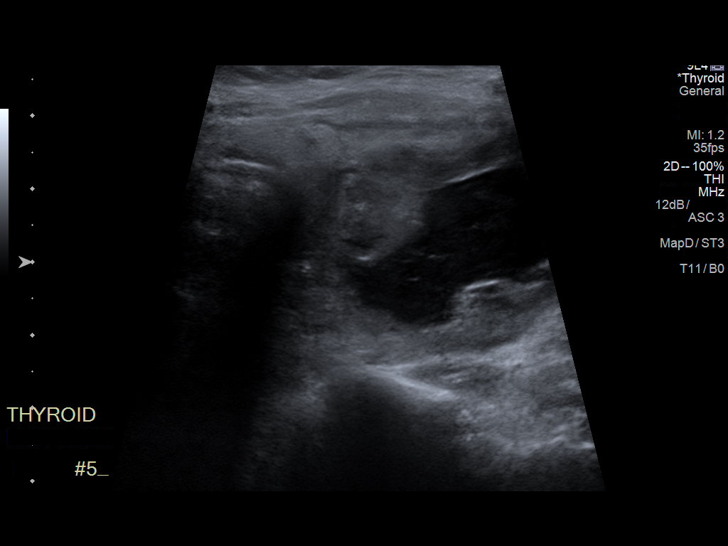
[im 17/22]
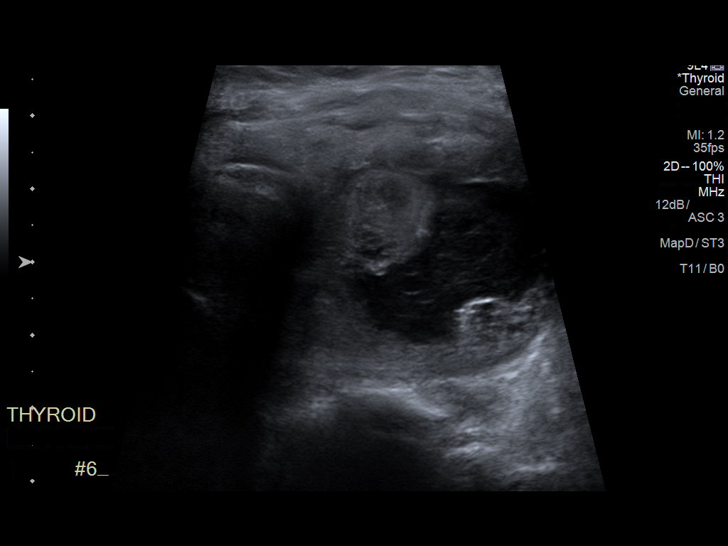
[im 18/22]
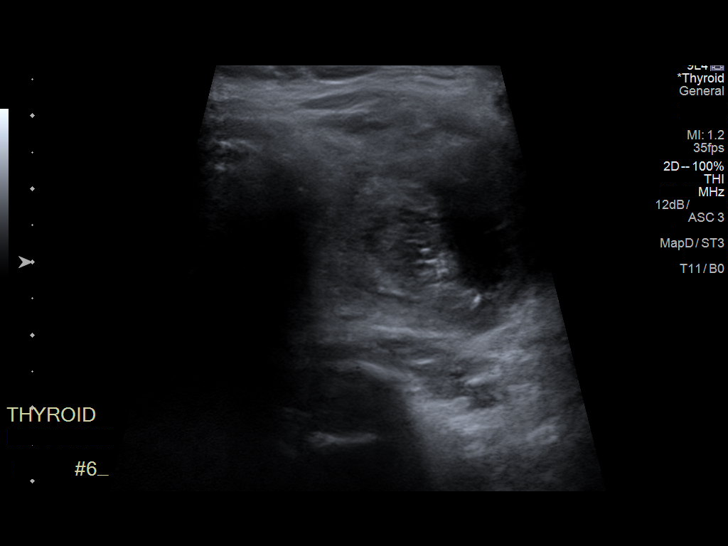
[im 20/22]
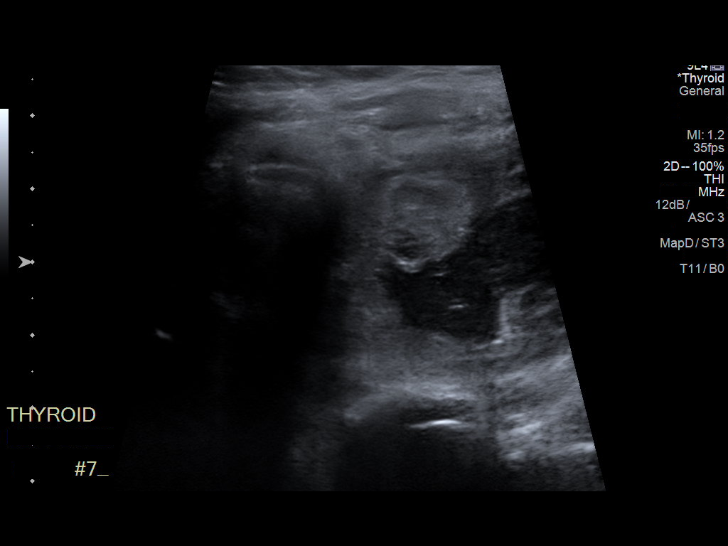
[im 22/22]
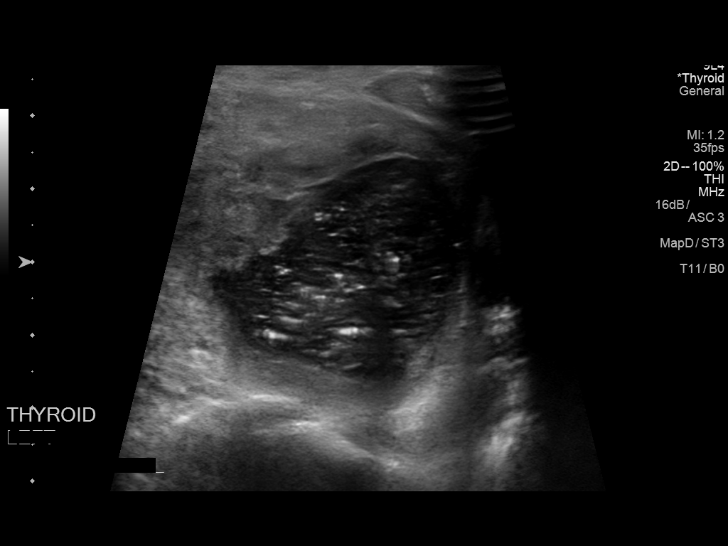

[13 of 22 positions shown; findings below may reference images not displayed]

Pre-procedural ultrasound scanning demonstrated unchanged size and
appearance of the indeterminate nodule within the left mid thyroid.

The procedure was planned. The neck was prepped in the usual sterile
fashion, and a sterile drape was applied covering the operative
field. A timeout was performed prior to the initiation of the
procedure. Local anesthesia was provided with 1% lidocaine.

Under direct ultrasound guidance, 5 FNA biopsies were performed of
the left mid nodule with a 25 gauge needle. Multiple ultrasound
images were saved for procedural documentation purposes. Also, using
a 22 gauge needle, 14 cc of brown liquid was aspirated from the
cystic component of this nodule. The samples were prepared and
submitted to pathology.

Limited post procedural scanning was negative for hematoma or
additional complication. Dressings were placed. The patient
tolerated the above procedures procedure well without immediate
postprocedural complication.
FINDINGS: Nodule reference number based on prior diagnostic ultrasound: 2

Maximum size: 4.5 cm

Location: Left; Mid

ACR TI-RADS risk category: TR5 (>/= 7 points)

Reason for biopsy: meets ACR TI-RADS criteria

Ultrasound imaging confirms appropriate placement of the needles
within the thyroid nodule.
IMPRESSION: Technically successful ultrasound guided fine needle aspiration of
nodule 2 within the left mid thyroid.

## 2019-08-12 IMAGING — US US THYROID
1 series · 13 of 25 positions shown · non-contrast
Comparison: Prior thyroid ultrasound 07/05/2017

CLINICAL DATA: Goiter. 62-year-old female with a history of thyroid
nodules. Prior biopsy of the left-sided thyroid nodule performed in
Friday June, 2017. Biopsy results were reportedly benign.

EXAM:
THYROID ULTRASOUND
TECHNIQUE: Ultrasound examination of the thyroid gland and adjacent soft
tissues was performed.

[Series 1: us thyroid · 0.05mm/px · 13 of 51 slices shown]
[im 1/51]
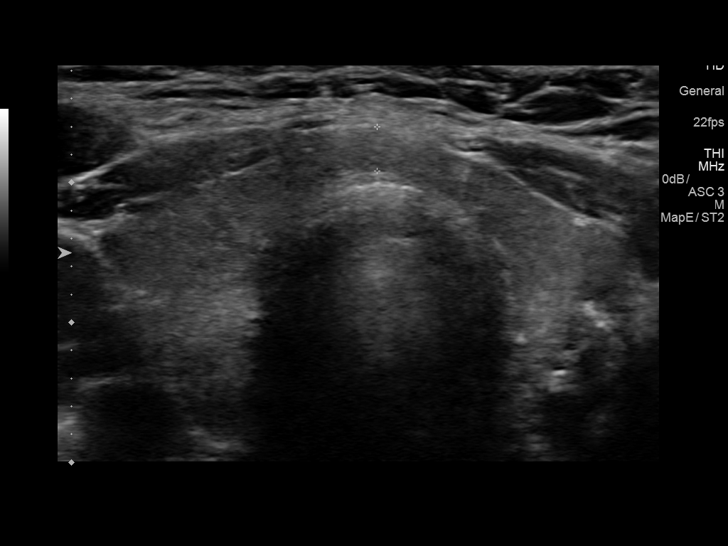
[im 5/51]
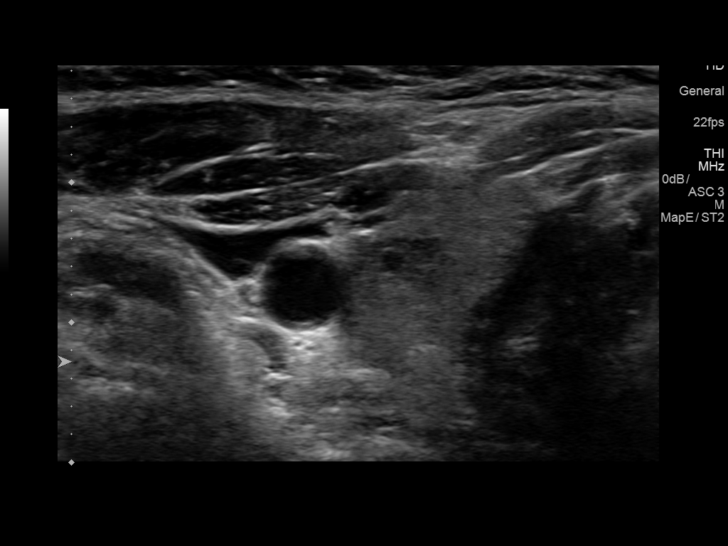
[im 9/51]
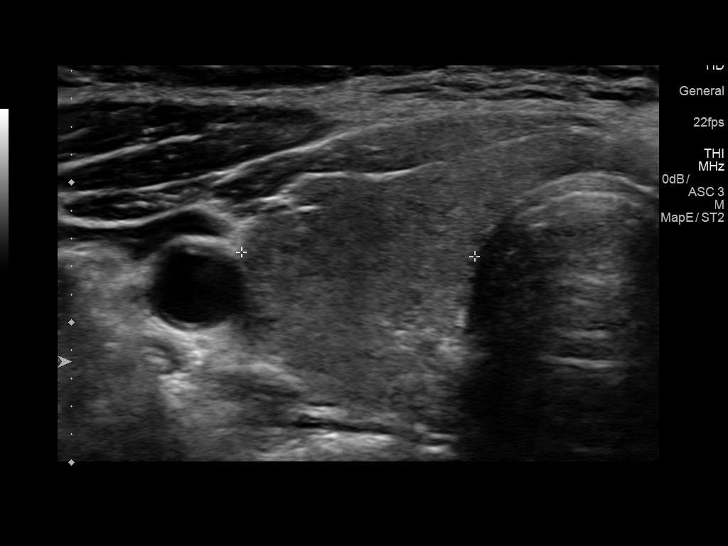
[im 13/51]
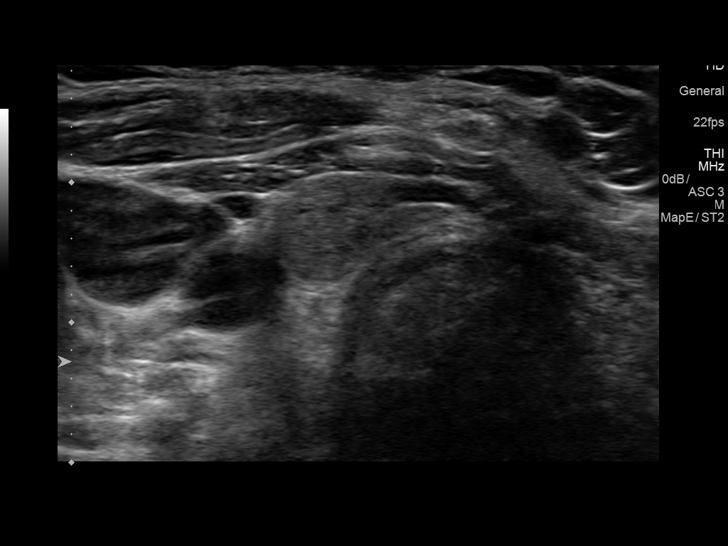
[im 17/51]
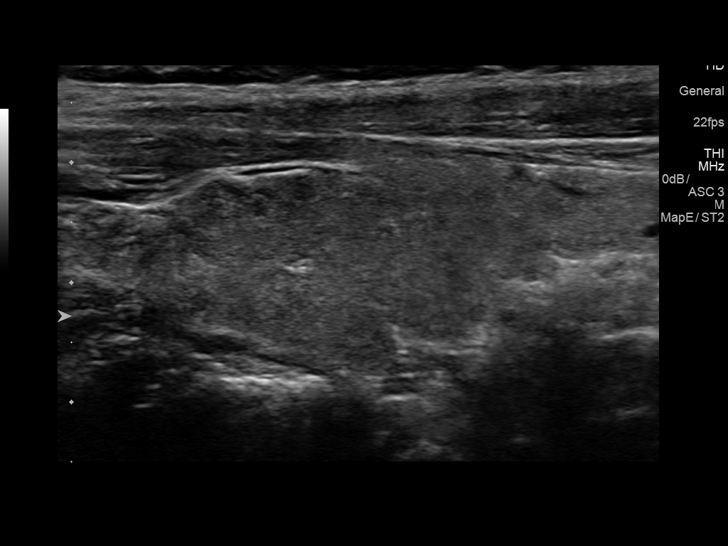
[im 21/51]
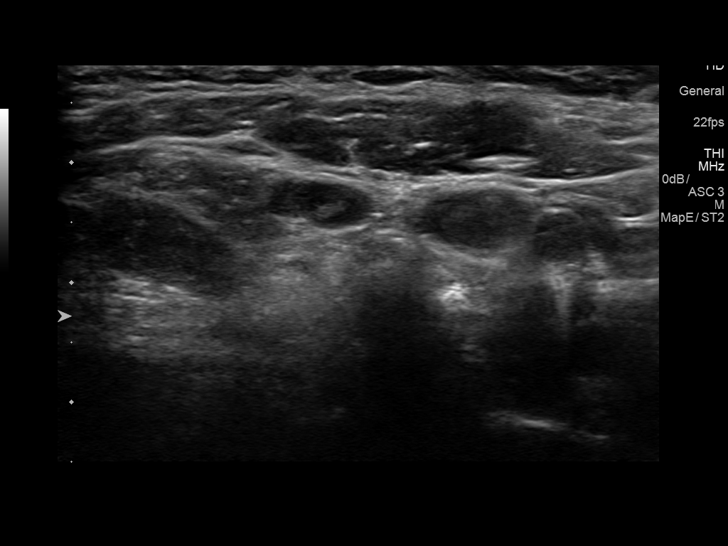
[im 26/51]
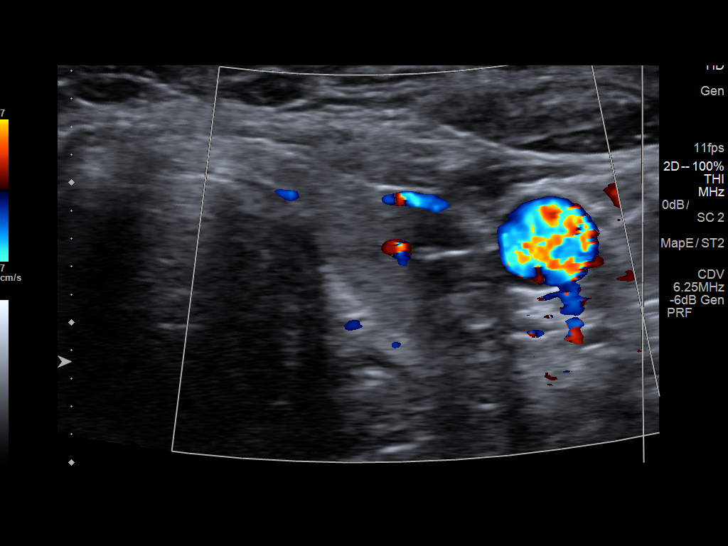
[im 30/51]
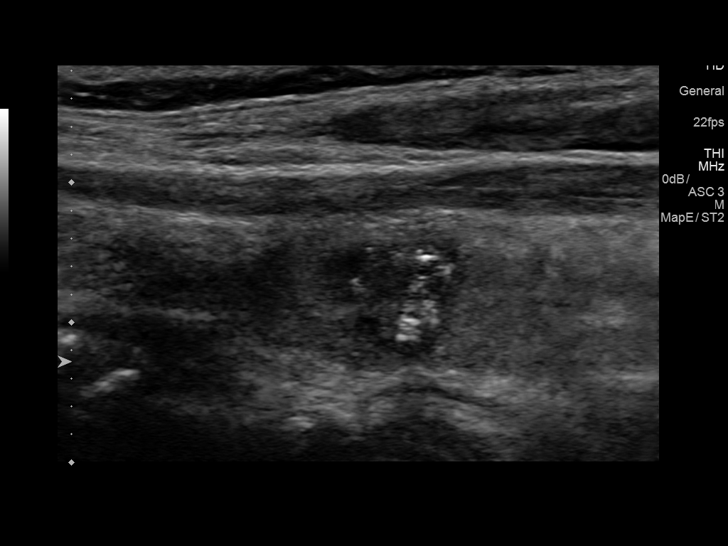
[im 34/51]
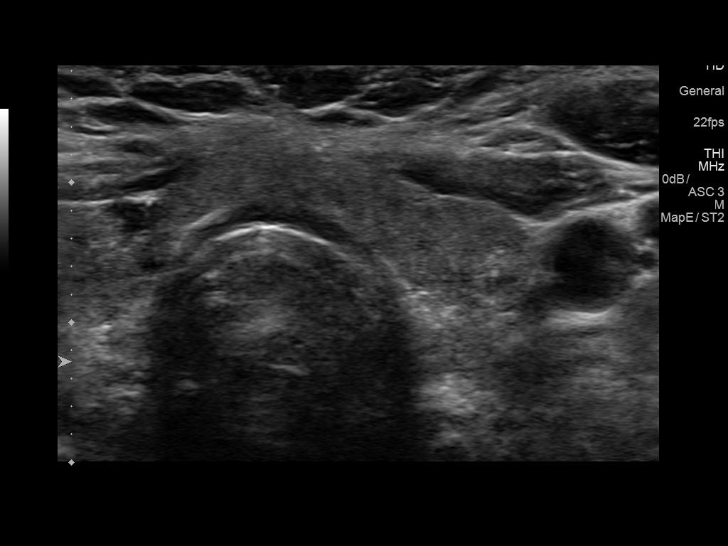
[im 38/51]
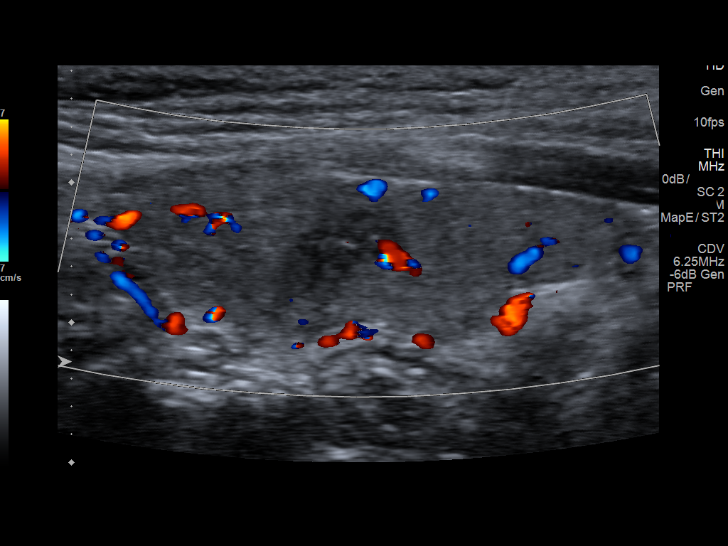
[im 42/51]
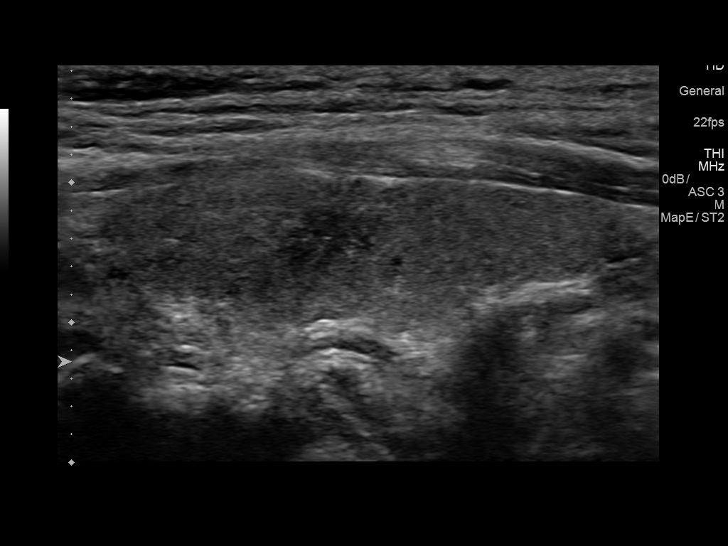
[im 46/51]
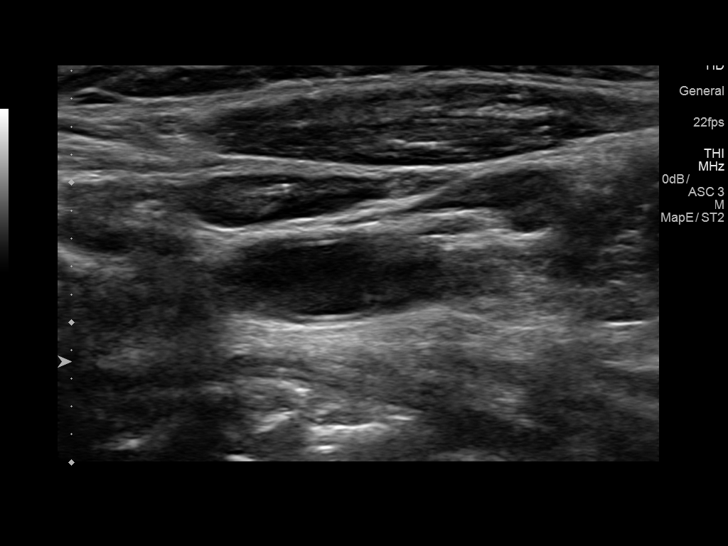
[im 51/51]
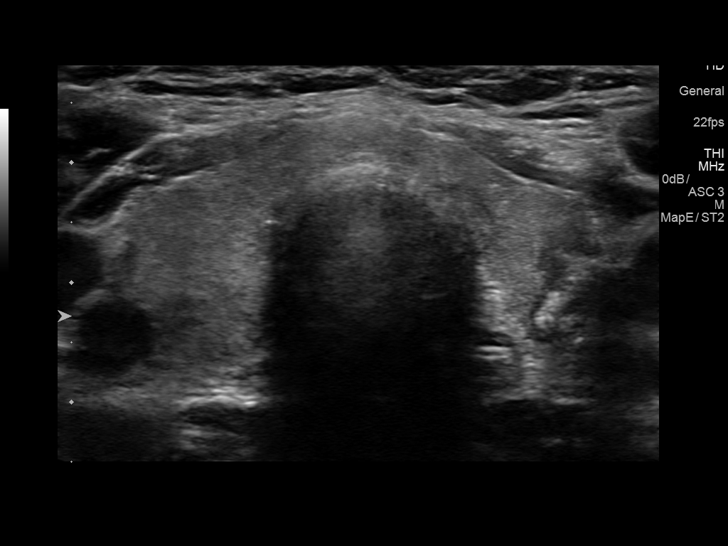

[13 of 25 positions shown; findings below may reference images not displayed]

FINDINGS: Parenchymal Echotexture: Mildly heterogenous

Isthmus: 0.3 cm

Right lobe: 4.4 x 1.8 x 1.7 cm

Left lobe: 3.9 x 1.3 x 1.3 cm

_________________________________________________________

Estimated total number of nodules >/= 1 cm: 1

Number of spongiform nodules >/=  2 cm not described below (TR1): 0

Number of mixed cystic and solid nodules >/= 1.5 cm not described
below (TR2): 0

_________________________________________________________

Previously biopsied and aspirated complex cystic nodule in the left
mid gland now measures 1.1 x 1.3 x 0.8 cm, significantly smaller
compared to 5.1 x 4.0 x 3.8 cm previously. Incidental note is again
made of a tiny subcentimeter hypoechoic solid nodule in the right
superior gland. This nodule does not meet criteria for further
evaluation.
IMPRESSION: Significant interval reduction in size of the previously
biopsied/aspirated nodule in the left mid gland. Recommend
correlation with prior biopsy results.

The above is in keeping with the ACR TI-RADS recommendations - [HOSPITAL] 4020;[DATE].

## 2020-03-05 ENCOUNTER — Institutional Professional Consult (permissible substitution): Payer: 59 | Admitting: Plastic Surgery

## 2020-04-08 ENCOUNTER — Encounter: Payer: Self-pay | Admitting: Plastic Surgery

## 2020-04-08 ENCOUNTER — Ambulatory Visit: Payer: Medicare Other | Admitting: Plastic Surgery

## 2020-04-08 ENCOUNTER — Other Ambulatory Visit: Payer: Self-pay

## 2020-04-08 VITALS — BP 129/77 | HR 77 | Temp 98.1°F | Ht 65.0 in | Wt 156.5 lb

## 2020-04-08 DIAGNOSIS — D171 Benign lipomatous neoplasm of skin and subcutaneous tissue of trunk: Secondary | ICD-10-CM

## 2020-04-08 NOTE — Progress Notes (Signed)
   Referring Provider Vernie Shanks, MD [place],  [place] [id number]   CC:  Chief Complaint  Patient presents with  . Consult      Jackie Lee is an 64 y.o. female.  HPI: Patient presents to discuss a lipoma in the left hip/flank area.  Is been present for a number of years and is growing in size.  She is particularly bothered by it when she lays on that side.  It is intermittently painful and she would like to have it removed.  Allergies  Allergen Reactions  . Betadine [Povidone Iodine]   . Flexeril [Cyclobenzaprine]   . Penicillins     Outpatient Encounter Medications as of 04/08/2020  Medication Sig  . ezetimibe (ZETIA) 10 MG tablet   . fenofibrate 160 MG tablet   . meloxicam (MOBIC) 15 MG tablet Take 15 mg by mouth daily.   No facility-administered encounter medications on file as of 04/08/2020.     Past Medical History:  Diagnosis Date  . Bicornate uterus   . GERD (gastroesophageal reflux disease)   . Hypercholesteremia   . Pelvic pain   . Suicide attempt [facility])    age 6    Past Surgical History:  Procedure Laterality Date  . CESAREAN SECTION      Family History  Problem Relation Age of Onset  . Stroke Mother 2       tia  . Cancer Paternal Aunt        uterine    Social History   Social History Narrative  . Not on file     Review of Systems General: Denies fevers, chills, weight loss CV: Denies chest pain, shortness of breath, palpitations  Physical Exam Vitals with BMI 04/08/2020  Height 5\' 5"   Weight 156 lbs 8 oz  BMI 92.42  Systolic 683  Diastolic 77  Pulse 77    General:  No acute distress,  Alert and oriented, Non-Toxic, Normal speech and affect Examination shows a mobile 5 cm lipoma in the left hip/flank area.  There is no overlying skin changes.  Assessment/Plan Patient presents with a lipoma in the left hip/flank area.  It symptomatic and I think warrants removal.  We discussed excision in the office versus  the operating room and she prefers to try to do this under local.  We discussed the risks and benefits that include bleeding, infection, damage to surrounding structures and need for additional procedures.  All of her questions were answered and we will plan to remove this in the office under local.  Cindra Presume 04/08/2020, 9:57 AM

## 2020-06-02 DIAGNOSIS — Z1152 Encounter for screening for COVID-19: Secondary | ICD-10-CM | POA: Diagnosis not present

## 2020-06-16 DIAGNOSIS — Z03818 Encounter for observation for suspected exposure to other biological agents ruled out: Secondary | ICD-10-CM | POA: Diagnosis not present

## 2020-06-30 DIAGNOSIS — M25562 Pain in left knee: Secondary | ICD-10-CM | POA: Diagnosis not present

## 2020-06-30 DIAGNOSIS — M25561 Pain in right knee: Secondary | ICD-10-CM | POA: Diagnosis not present

## 2020-06-30 DIAGNOSIS — G8929 Other chronic pain: Secondary | ICD-10-CM | POA: Diagnosis not present

## 2020-07-01 ENCOUNTER — Other Ambulatory Visit: Payer: Self-pay

## 2020-07-01 ENCOUNTER — Encounter: Payer: Self-pay | Admitting: Plastic Surgery

## 2020-07-01 ENCOUNTER — Other Ambulatory Visit (HOSPITAL_COMMUNITY)
Admission: RE | Admit: 2020-07-01 | Discharge: 2020-07-01 | Disposition: A | Discharge status: Home / Self Care | Payer: Medicare Other | Source: Ambulatory Visit | Attending: Plastic Surgery | Admitting: Plastic Surgery

## 2020-07-01 ENCOUNTER — Ambulatory Visit: Payer: Medicare Other | Admitting: Plastic Surgery

## 2020-07-01 VITALS — BP 130/82 | HR 71

## 2020-07-01 DIAGNOSIS — D171 Benign lipomatous neoplasm of skin and subcutaneous tissue of trunk: Secondary | ICD-10-CM | POA: Diagnosis not present

## 2020-07-01 NOTE — Progress Notes (Signed)
Operative Note   DATE OF OPERATION: 07/01/2020  LOCATION:    SURGICAL DEPARTMENT: Plastic Surgery  PREOPERATIVE DIAGNOSES: Left flank lipoma  POSTOPERATIVE DIAGNOSES:  same  PROCEDURE:  1. Excision of left flank subcutaneous lipoma measuring 8 cm 2. Complex closure measuring 8 cm  SURGEON: Talmadge Coventry, MD  ANESTHESIA:  Local  COMPLICATIONS: None.   INDICATIONS FOR PROCEDURE:  The patient, Jackie Lee is a 65 y.o. female born on 07-29-1955, is here for treatment of left flank subcutaneous lipoma MRN: [id number]  CONSENT:  Informed consent was obtained directly from the patient. Risks, benefits and alternatives were fully discussed. Specific risks including but not limited to bleeding, infection, hematoma, seroma, scarring, pain, infection, wound healing problems, and need for further surgery were all discussed. The patient did have an ample opportunity to have questions answered to satisfaction.   DESCRIPTION OF PROCEDURE:  Local anesthesia was administered. The patient's operative site was prepped and draped in a sterile fashion. A time out was performed and all information was confirmed to be correct.  The lesion was excised with a 15 blade, tenotomy dissection and pressure.  Hemostasis was obtained.  Circumferential undermining was performed and the skin was advanced and closed in layers with interrupted buried Monocryl sutures and 4-0 Monocryl for the skin.  The lesion excised measured 8 cm, and the total length of closure measured 8 cm.    The patient tolerated the procedure well.  There were no complications.

## 2020-07-02 LAB — SURGICAL PATHOLOGY

## 2020-07-06 DIAGNOSIS — M25561 Pain in right knee: Secondary | ICD-10-CM | POA: Diagnosis not present

## 2020-07-06 DIAGNOSIS — M25562 Pain in left knee: Secondary | ICD-10-CM | POA: Diagnosis not present

## 2020-07-09 DIAGNOSIS — M1711 Unilateral primary osteoarthritis, right knee: Secondary | ICD-10-CM | POA: Diagnosis not present

## 2020-07-14 NOTE — Progress Notes (Signed)
Patient is a 65 year old female here for follow-up after undergoing excision of left flank subcutaneous lipoma measuring 8 cm on 07/01/2020 with Dr. Claudia Desanctis.  Pathology results (reviewed with patient): Mature fibroadipose tissue, clinically Lipoma,  ~ 2 weeks PO Patient reports she is doing very well.  Reports the incision site itches.  No other complaints.  Denies fever/chills, nausea/vomiting, incision pain.  Incision is healing very nicely, C/D/I.  Mild irritation erythema and dry skin present.  Stitches were removed today.  No signs of infection or drainage.  May apply Vaseline to incision site.  Follow-up as needed.  Return precautions provided.  Call office with any questions/concerns.  No photo taken as Raeanne Gathers was not working today.

## 2020-07-17 ENCOUNTER — Ambulatory Visit (INDEPENDENT_AMBULATORY_CARE_PROVIDER_SITE_OTHER): Payer: Medicare Other | Admitting: Plastic Surgery

## 2020-07-17 ENCOUNTER — Other Ambulatory Visit: Payer: Self-pay

## 2020-07-17 ENCOUNTER — Encounter: Payer: Self-pay | Admitting: Plastic Surgery

## 2020-07-17 VITALS — BP 139/77 | HR 64

## 2020-07-17 DIAGNOSIS — D171 Benign lipomatous neoplasm of skin and subcutaneous tissue of trunk: Secondary | ICD-10-CM

## 2020-08-03 DIAGNOSIS — M170 Bilateral primary osteoarthritis of knee: Secondary | ICD-10-CM | POA: Diagnosis not present

## 2020-08-10 DIAGNOSIS — M170 Bilateral primary osteoarthritis of knee: Secondary | ICD-10-CM | POA: Diagnosis not present

## 2020-08-17 DIAGNOSIS — M170 Bilateral primary osteoarthritis of knee: Secondary | ICD-10-CM | POA: Diagnosis not present

## 2020-09-21 DIAGNOSIS — M1711 Unilateral primary osteoarthritis, right knee: Secondary | ICD-10-CM | POA: Diagnosis not present

## 2020-10-06 DIAGNOSIS — D1801 Hemangioma of skin and subcutaneous tissue: Secondary | ICD-10-CM | POA: Diagnosis not present

## 2020-10-06 DIAGNOSIS — D225 Melanocytic nevi of trunk: Secondary | ICD-10-CM | POA: Diagnosis not present

## 2020-10-06 DIAGNOSIS — L720 Epidermal cyst: Secondary | ICD-10-CM | POA: Diagnosis not present

## 2021-01-07 DIAGNOSIS — M8588 Other specified disorders of bone density and structure, other site: Secondary | ICD-10-CM | POA: Diagnosis not present

## 2021-01-07 DIAGNOSIS — Z23 Encounter for immunization: Secondary | ICD-10-CM | POA: Diagnosis not present

## 2021-01-07 DIAGNOSIS — E559 Vitamin D deficiency, unspecified: Secondary | ICD-10-CM | POA: Diagnosis not present

## 2021-01-07 DIAGNOSIS — E782 Mixed hyperlipidemia: Secondary | ICD-10-CM | POA: Diagnosis not present

## 2021-01-07 DIAGNOSIS — G43909 Migraine, unspecified, not intractable, without status migrainosus: Secondary | ICD-10-CM | POA: Diagnosis not present

## 2021-01-07 DIAGNOSIS — R140 Abdominal distension (gaseous): Secondary | ICD-10-CM | POA: Diagnosis not present

## 2021-01-07 DIAGNOSIS — L290 Pruritus ani: Secondary | ICD-10-CM | POA: Diagnosis not present

## 2021-01-07 DIAGNOSIS — R7303 Prediabetes: Secondary | ICD-10-CM | POA: Diagnosis not present

## 2021-01-07 DIAGNOSIS — Z0000 Encounter for general adult medical examination without abnormal findings: Secondary | ICD-10-CM | POA: Diagnosis not present

## 2021-01-11 DIAGNOSIS — M170 Bilateral primary osteoarthritis of knee: Secondary | ICD-10-CM | POA: Diagnosis not present

## 2021-03-05 DIAGNOSIS — Z1231 Encounter for screening mammogram for malignant neoplasm of breast: Secondary | ICD-10-CM | POA: Diagnosis not present

## 2021-03-19 DIAGNOSIS — R922 Inconclusive mammogram: Secondary | ICD-10-CM | POA: Diagnosis not present

## 2021-03-19 DIAGNOSIS — R928 Other abnormal and inconclusive findings on diagnostic imaging of breast: Secondary | ICD-10-CM | POA: Diagnosis not present

## 2021-04-19 DIAGNOSIS — E559 Vitamin D deficiency, unspecified: Secondary | ICD-10-CM | POA: Diagnosis not present

## 2021-04-19 DIAGNOSIS — H40013 Open angle with borderline findings, low risk, bilateral: Secondary | ICD-10-CM | POA: Diagnosis not present

## 2021-04-19 DIAGNOSIS — M8588 Other specified disorders of bone density and structure, other site: Secondary | ICD-10-CM | POA: Diagnosis not present

## 2021-04-19 DIAGNOSIS — R7303 Prediabetes: Secondary | ICD-10-CM | POA: Diagnosis not present

## 2021-04-19 DIAGNOSIS — H524 Presbyopia: Secondary | ICD-10-CM | POA: Diagnosis not present

## 2021-04-19 DIAGNOSIS — L290 Pruritus ani: Secondary | ICD-10-CM | POA: Diagnosis not present

## 2021-04-19 DIAGNOSIS — E782 Mixed hyperlipidemia: Secondary | ICD-10-CM | POA: Diagnosis not present

## 2021-04-19 DIAGNOSIS — K640 First degree hemorrhoids: Secondary | ICD-10-CM | POA: Diagnosis not present

## 2021-04-20 DIAGNOSIS — L290 Pruritus ani: Secondary | ICD-10-CM | POA: Diagnosis not present

## 2021-07-06 DIAGNOSIS — J069 Acute upper respiratory infection, unspecified: Secondary | ICD-10-CM | POA: Diagnosis not present

## 2021-07-06 DIAGNOSIS — J029 Acute pharyngitis, unspecified: Secondary | ICD-10-CM | POA: Diagnosis not present

## 2021-07-06 DIAGNOSIS — Z20822 Contact with and (suspected) exposure to covid-19: Secondary | ICD-10-CM | POA: Diagnosis not present

## 2021-08-17 DIAGNOSIS — E782 Mixed hyperlipidemia: Secondary | ICD-10-CM | POA: Diagnosis not present

## 2021-08-17 DIAGNOSIS — E559 Vitamin D deficiency, unspecified: Secondary | ICD-10-CM | POA: Diagnosis not present

## 2021-08-17 DIAGNOSIS — T466X5A Adverse effect of antihyperlipidemic and antiarteriosclerotic drugs, initial encounter: Secondary | ICD-10-CM | POA: Diagnosis not present

## 2021-08-17 DIAGNOSIS — M791 Myalgia, unspecified site: Secondary | ICD-10-CM | POA: Diagnosis not present

## 2021-08-17 DIAGNOSIS — M8588 Other specified disorders of bone density and structure, other site: Secondary | ICD-10-CM | POA: Diagnosis not present

## 2021-08-17 DIAGNOSIS — R7303 Prediabetes: Secondary | ICD-10-CM | POA: Diagnosis not present

## 2021-09-06 DIAGNOSIS — M170 Bilateral primary osteoarthritis of knee: Secondary | ICD-10-CM | POA: Diagnosis not present

## 2021-10-11 DIAGNOSIS — N644 Mastodynia: Secondary | ICD-10-CM | POA: Diagnosis not present

## 2021-10-14 DIAGNOSIS — R928 Other abnormal and inconclusive findings on diagnostic imaging of breast: Secondary | ICD-10-CM | POA: Diagnosis not present

## 2021-10-19 DIAGNOSIS — L814 Other melanin hyperpigmentation: Secondary | ICD-10-CM | POA: Diagnosis not present

## 2021-10-19 DIAGNOSIS — D225 Melanocytic nevi of trunk: Secondary | ICD-10-CM | POA: Diagnosis not present

## 2021-10-19 DIAGNOSIS — L239 Allergic contact dermatitis, unspecified cause: Secondary | ICD-10-CM | POA: Diagnosis not present

## 2021-10-19 DIAGNOSIS — L309 Dermatitis, unspecified: Secondary | ICD-10-CM | POA: Diagnosis not present

## 2021-10-19 DIAGNOSIS — D1801 Hemangioma of skin and subcutaneous tissue: Secondary | ICD-10-CM | POA: Diagnosis not present

## 2021-11-02 DIAGNOSIS — R5383 Other fatigue: Secondary | ICD-10-CM | POA: Diagnosis not present

## 2021-11-02 DIAGNOSIS — S50361A Insect bite (nonvenomous) of right elbow, initial encounter: Secondary | ICD-10-CM | POA: Diagnosis not present

## 2021-11-02 DIAGNOSIS — W57XXXS Bitten or stung by nonvenomous insect and other nonvenomous arthropods, sequela: Secondary | ICD-10-CM | POA: Diagnosis not present

## 2021-11-02 DIAGNOSIS — R21 Rash and other nonspecific skin eruption: Secondary | ICD-10-CM | POA: Diagnosis not present

## 2021-11-02 DIAGNOSIS — M2550 Pain in unspecified joint: Secondary | ICD-10-CM | POA: Diagnosis not present

## 2021-11-02 DIAGNOSIS — R110 Nausea: Secondary | ICD-10-CM | POA: Diagnosis not present

## 2022-02-01 DIAGNOSIS — M170 Bilateral primary osteoarthritis of knee: Secondary | ICD-10-CM | POA: Diagnosis not present

## 2022-02-01 DIAGNOSIS — Z01812 Encounter for preprocedural laboratory examination: Secondary | ICD-10-CM | POA: Diagnosis not present

## 2022-02-01 DIAGNOSIS — M1711 Unilateral primary osteoarthritis, right knee: Secondary | ICD-10-CM | POA: Diagnosis not present

## 2022-02-01 DIAGNOSIS — M25561 Pain in right knee: Secondary | ICD-10-CM | POA: Diagnosis not present

## 2022-02-17 DIAGNOSIS — M25661 Stiffness of right knee, not elsewhere classified: Secondary | ICD-10-CM | POA: Diagnosis not present

## 2022-02-17 DIAGNOSIS — M1711 Unilateral primary osteoarthritis, right knee: Secondary | ICD-10-CM | POA: Diagnosis not present

## 2022-02-17 DIAGNOSIS — R262 Difficulty in walking, not elsewhere classified: Secondary | ICD-10-CM | POA: Diagnosis not present

## 2022-02-21 DIAGNOSIS — M810 Age-related osteoporosis without current pathological fracture: Secondary | ICD-10-CM | POA: Diagnosis not present

## 2022-02-21 DIAGNOSIS — M85851 Other specified disorders of bone density and structure, right thigh: Secondary | ICD-10-CM | POA: Diagnosis not present

## 2022-02-22 ENCOUNTER — Ambulatory Visit: Payer: Self-pay | Admitting: Allergy & Immunology

## 2022-02-23 DIAGNOSIS — M170 Bilateral primary osteoarthritis of knee: Secondary | ICD-10-CM | POA: Diagnosis not present

## 2022-02-23 DIAGNOSIS — M1711 Unilateral primary osteoarthritis, right knee: Secondary | ICD-10-CM | POA: Diagnosis not present

## 2022-02-23 DIAGNOSIS — Z96651 Presence of right artificial knee joint: Secondary | ICD-10-CM | POA: Diagnosis not present

## 2022-02-23 DIAGNOSIS — G8918 Other acute postprocedural pain: Secondary | ICD-10-CM | POA: Diagnosis not present

## 2022-02-25 DIAGNOSIS — M1711 Unilateral primary osteoarthritis, right knee: Secondary | ICD-10-CM | POA: Diagnosis not present

## 2022-02-25 DIAGNOSIS — M25661 Stiffness of right knee, not elsewhere classified: Secondary | ICD-10-CM | POA: Diagnosis not present

## 2022-02-25 DIAGNOSIS — R262 Difficulty in walking, not elsewhere classified: Secondary | ICD-10-CM | POA: Diagnosis not present

## 2022-02-28 DIAGNOSIS — M25661 Stiffness of right knee, not elsewhere classified: Secondary | ICD-10-CM | POA: Diagnosis not present

## 2022-02-28 DIAGNOSIS — M1711 Unilateral primary osteoarthritis, right knee: Secondary | ICD-10-CM | POA: Diagnosis not present

## 2022-02-28 DIAGNOSIS — R262 Difficulty in walking, not elsewhere classified: Secondary | ICD-10-CM | POA: Diagnosis not present

## 2022-03-02 DIAGNOSIS — M1711 Unilateral primary osteoarthritis, right knee: Secondary | ICD-10-CM | POA: Diagnosis not present

## 2022-03-02 DIAGNOSIS — M25661 Stiffness of right knee, not elsewhere classified: Secondary | ICD-10-CM | POA: Diagnosis not present

## 2022-03-02 DIAGNOSIS — R262 Difficulty in walking, not elsewhere classified: Secondary | ICD-10-CM | POA: Diagnosis not present

## 2022-03-04 DIAGNOSIS — M1711 Unilateral primary osteoarthritis, right knee: Secondary | ICD-10-CM | POA: Diagnosis not present

## 2022-03-09 DIAGNOSIS — N390 Urinary tract infection, site not specified: Secondary | ICD-10-CM | POA: Diagnosis not present

## 2022-03-09 DIAGNOSIS — R350 Frequency of micturition: Secondary | ICD-10-CM | POA: Diagnosis not present

## 2022-03-15 DIAGNOSIS — M25661 Stiffness of right knee, not elsewhere classified: Secondary | ICD-10-CM | POA: Diagnosis not present

## 2022-03-15 DIAGNOSIS — M1711 Unilateral primary osteoarthritis, right knee: Secondary | ICD-10-CM | POA: Diagnosis not present

## 2022-03-15 DIAGNOSIS — R262 Difficulty in walking, not elsewhere classified: Secondary | ICD-10-CM | POA: Diagnosis not present

## 2022-03-17 DIAGNOSIS — M1711 Unilateral primary osteoarthritis, right knee: Secondary | ICD-10-CM | POA: Diagnosis not present

## 2022-03-17 DIAGNOSIS — R262 Difficulty in walking, not elsewhere classified: Secondary | ICD-10-CM | POA: Diagnosis not present

## 2022-03-17 DIAGNOSIS — M25661 Stiffness of right knee, not elsewhere classified: Secondary | ICD-10-CM | POA: Diagnosis not present

## 2022-03-21 DIAGNOSIS — M25661 Stiffness of right knee, not elsewhere classified: Secondary | ICD-10-CM | POA: Diagnosis not present

## 2022-03-21 DIAGNOSIS — M1711 Unilateral primary osteoarthritis, right knee: Secondary | ICD-10-CM | POA: Diagnosis not present

## 2022-03-21 DIAGNOSIS — R262 Difficulty in walking, not elsewhere classified: Secondary | ICD-10-CM | POA: Diagnosis not present

## 2022-03-22 DIAGNOSIS — M1711 Unilateral primary osteoarthritis, right knee: Secondary | ICD-10-CM | POA: Diagnosis not present

## 2022-03-22 DIAGNOSIS — E782 Mixed hyperlipidemia: Secondary | ICD-10-CM | POA: Diagnosis not present

## 2022-03-22 DIAGNOSIS — M810 Age-related osteoporosis without current pathological fracture: Secondary | ICD-10-CM | POA: Diagnosis not present

## 2022-03-23 DIAGNOSIS — M25661 Stiffness of right knee, not elsewhere classified: Secondary | ICD-10-CM | POA: Diagnosis not present

## 2022-03-23 DIAGNOSIS — M1711 Unilateral primary osteoarthritis, right knee: Secondary | ICD-10-CM | POA: Diagnosis not present

## 2022-03-23 DIAGNOSIS — R262 Difficulty in walking, not elsewhere classified: Secondary | ICD-10-CM | POA: Diagnosis not present

## 2022-03-29 DIAGNOSIS — M25661 Stiffness of right knee, not elsewhere classified: Secondary | ICD-10-CM | POA: Diagnosis not present

## 2022-03-29 DIAGNOSIS — R262 Difficulty in walking, not elsewhere classified: Secondary | ICD-10-CM | POA: Diagnosis not present

## 2022-03-29 DIAGNOSIS — M1711 Unilateral primary osteoarthritis, right knee: Secondary | ICD-10-CM | POA: Diagnosis not present

## 2022-04-05 DIAGNOSIS — M25661 Stiffness of right knee, not elsewhere classified: Secondary | ICD-10-CM | POA: Diagnosis not present

## 2022-04-05 DIAGNOSIS — M1711 Unilateral primary osteoarthritis, right knee: Secondary | ICD-10-CM | POA: Diagnosis not present

## 2022-04-05 DIAGNOSIS — R262 Difficulty in walking, not elsewhere classified: Secondary | ICD-10-CM | POA: Diagnosis not present

## 2022-04-12 DIAGNOSIS — R262 Difficulty in walking, not elsewhere classified: Secondary | ICD-10-CM | POA: Diagnosis not present

## 2022-04-12 DIAGNOSIS — M25562 Pain in left knee: Secondary | ICD-10-CM | POA: Diagnosis not present

## 2022-04-12 DIAGNOSIS — M25661 Stiffness of right knee, not elsewhere classified: Secondary | ICD-10-CM | POA: Diagnosis not present

## 2022-04-12 DIAGNOSIS — Z01812 Encounter for preprocedural laboratory examination: Secondary | ICD-10-CM | POA: Diagnosis not present

## 2022-04-12 DIAGNOSIS — M1712 Unilateral primary osteoarthritis, left knee: Secondary | ICD-10-CM | POA: Diagnosis not present

## 2022-04-12 DIAGNOSIS — M1711 Unilateral primary osteoarthritis, right knee: Secondary | ICD-10-CM | POA: Diagnosis not present

## 2022-04-19 DIAGNOSIS — K5732 Diverticulitis of large intestine without perforation or abscess without bleeding: Secondary | ICD-10-CM | POA: Diagnosis not present

## 2022-04-19 DIAGNOSIS — R194 Change in bowel habit: Secondary | ICD-10-CM | POA: Diagnosis not present

## 2022-04-19 DIAGNOSIS — K5730 Diverticulosis of large intestine without perforation or abscess without bleeding: Secondary | ICD-10-CM | POA: Diagnosis not present

## 2022-04-19 DIAGNOSIS — R1032 Left lower quadrant pain: Secondary | ICD-10-CM | POA: Diagnosis not present

## 2022-04-20 DIAGNOSIS — H40013 Open angle with borderline findings, low risk, bilateral: Secondary | ICD-10-CM | POA: Diagnosis not present

## 2022-04-27 DIAGNOSIS — G8918 Other acute postprocedural pain: Secondary | ICD-10-CM | POA: Diagnosis not present

## 2022-04-27 DIAGNOSIS — Z96652 Presence of left artificial knee joint: Secondary | ICD-10-CM | POA: Diagnosis not present

## 2022-04-27 DIAGNOSIS — M1712 Unilateral primary osteoarthritis, left knee: Secondary | ICD-10-CM | POA: Diagnosis not present

## 2022-04-29 DIAGNOSIS — M1712 Unilateral primary osteoarthritis, left knee: Secondary | ICD-10-CM | POA: Diagnosis not present

## 2022-04-29 DIAGNOSIS — Z96652 Presence of left artificial knee joint: Secondary | ICD-10-CM | POA: Diagnosis not present

## 2022-04-29 DIAGNOSIS — M25662 Stiffness of left knee, not elsewhere classified: Secondary | ICD-10-CM | POA: Diagnosis not present

## 2022-04-29 DIAGNOSIS — M6281 Muscle weakness (generalized): Secondary | ICD-10-CM | POA: Diagnosis not present

## 2022-04-29 DIAGNOSIS — R262 Difficulty in walking, not elsewhere classified: Secondary | ICD-10-CM | POA: Diagnosis not present

## 2022-05-04 DIAGNOSIS — M1712 Unilateral primary osteoarthritis, left knee: Secondary | ICD-10-CM | POA: Diagnosis not present

## 2022-05-04 DIAGNOSIS — R262 Difficulty in walking, not elsewhere classified: Secondary | ICD-10-CM | POA: Diagnosis not present

## 2022-05-04 DIAGNOSIS — M6281 Muscle weakness (generalized): Secondary | ICD-10-CM | POA: Diagnosis not present

## 2022-05-04 DIAGNOSIS — M25662 Stiffness of left knee, not elsewhere classified: Secondary | ICD-10-CM | POA: Diagnosis not present

## 2022-05-04 DIAGNOSIS — Z96652 Presence of left artificial knee joint: Secondary | ICD-10-CM | POA: Diagnosis not present

## 2022-05-05 DIAGNOSIS — M6281 Muscle weakness (generalized): Secondary | ICD-10-CM | POA: Diagnosis not present

## 2022-05-05 DIAGNOSIS — M1712 Unilateral primary osteoarthritis, left knee: Secondary | ICD-10-CM | POA: Diagnosis not present

## 2022-05-05 DIAGNOSIS — Z96652 Presence of left artificial knee joint: Secondary | ICD-10-CM | POA: Diagnosis not present

## 2022-05-05 DIAGNOSIS — M25662 Stiffness of left knee, not elsewhere classified: Secondary | ICD-10-CM | POA: Diagnosis not present

## 2022-05-05 DIAGNOSIS — R262 Difficulty in walking, not elsewhere classified: Secondary | ICD-10-CM | POA: Diagnosis not present

## 2022-05-10 DIAGNOSIS — M25662 Stiffness of left knee, not elsewhere classified: Secondary | ICD-10-CM | POA: Diagnosis not present

## 2022-05-10 DIAGNOSIS — R262 Difficulty in walking, not elsewhere classified: Secondary | ICD-10-CM | POA: Diagnosis not present

## 2022-05-10 DIAGNOSIS — M6281 Muscle weakness (generalized): Secondary | ICD-10-CM | POA: Diagnosis not present

## 2022-05-10 DIAGNOSIS — Z96652 Presence of left artificial knee joint: Secondary | ICD-10-CM | POA: Diagnosis not present

## 2022-05-10 DIAGNOSIS — M1712 Unilateral primary osteoarthritis, left knee: Secondary | ICD-10-CM | POA: Diagnosis not present

## 2022-05-12 DIAGNOSIS — M1712 Unilateral primary osteoarthritis, left knee: Secondary | ICD-10-CM | POA: Diagnosis not present

## 2022-05-12 DIAGNOSIS — Z96652 Presence of left artificial knee joint: Secondary | ICD-10-CM | POA: Diagnosis not present

## 2022-05-12 DIAGNOSIS — M25662 Stiffness of left knee, not elsewhere classified: Secondary | ICD-10-CM | POA: Diagnosis not present

## 2022-05-12 DIAGNOSIS — R262 Difficulty in walking, not elsewhere classified: Secondary | ICD-10-CM | POA: Diagnosis not present

## 2022-05-12 DIAGNOSIS — M6281 Muscle weakness (generalized): Secondary | ICD-10-CM | POA: Diagnosis not present

## 2022-05-17 DIAGNOSIS — M6281 Muscle weakness (generalized): Secondary | ICD-10-CM | POA: Diagnosis not present

## 2022-05-17 DIAGNOSIS — Z96652 Presence of left artificial knee joint: Secondary | ICD-10-CM | POA: Diagnosis not present

## 2022-05-17 DIAGNOSIS — R262 Difficulty in walking, not elsewhere classified: Secondary | ICD-10-CM | POA: Diagnosis not present

## 2022-05-17 DIAGNOSIS — M1712 Unilateral primary osteoarthritis, left knee: Secondary | ICD-10-CM | POA: Diagnosis not present

## 2022-05-17 DIAGNOSIS — M25662 Stiffness of left knee, not elsewhere classified: Secondary | ICD-10-CM | POA: Diagnosis not present

## 2022-05-19 DIAGNOSIS — M6281 Muscle weakness (generalized): Secondary | ICD-10-CM | POA: Diagnosis not present

## 2022-05-19 DIAGNOSIS — R262 Difficulty in walking, not elsewhere classified: Secondary | ICD-10-CM | POA: Diagnosis not present

## 2022-05-19 DIAGNOSIS — M1712 Unilateral primary osteoarthritis, left knee: Secondary | ICD-10-CM | POA: Diagnosis not present

## 2022-05-19 DIAGNOSIS — M25662 Stiffness of left knee, not elsewhere classified: Secondary | ICD-10-CM | POA: Diagnosis not present

## 2022-05-19 DIAGNOSIS — Z96652 Presence of left artificial knee joint: Secondary | ICD-10-CM | POA: Diagnosis not present

## 2022-05-24 DIAGNOSIS — M1712 Unilateral primary osteoarthritis, left knee: Secondary | ICD-10-CM | POA: Diagnosis not present

## 2022-05-25 DIAGNOSIS — M1712 Unilateral primary osteoarthritis, left knee: Secondary | ICD-10-CM | POA: Diagnosis not present

## 2022-05-25 DIAGNOSIS — Z96652 Presence of left artificial knee joint: Secondary | ICD-10-CM | POA: Diagnosis not present

## 2022-05-25 DIAGNOSIS — R262 Difficulty in walking, not elsewhere classified: Secondary | ICD-10-CM | POA: Diagnosis not present

## 2022-05-25 DIAGNOSIS — M25662 Stiffness of left knee, not elsewhere classified: Secondary | ICD-10-CM | POA: Diagnosis not present

## 2022-05-25 DIAGNOSIS — M6281 Muscle weakness (generalized): Secondary | ICD-10-CM | POA: Diagnosis not present

## 2022-05-27 DIAGNOSIS — M6281 Muscle weakness (generalized): Secondary | ICD-10-CM | POA: Diagnosis not present

## 2022-05-27 DIAGNOSIS — M25662 Stiffness of left knee, not elsewhere classified: Secondary | ICD-10-CM | POA: Diagnosis not present

## 2022-05-27 DIAGNOSIS — Z96652 Presence of left artificial knee joint: Secondary | ICD-10-CM | POA: Diagnosis not present

## 2022-05-27 DIAGNOSIS — M1712 Unilateral primary osteoarthritis, left knee: Secondary | ICD-10-CM | POA: Diagnosis not present

## 2022-05-27 DIAGNOSIS — R262 Difficulty in walking, not elsewhere classified: Secondary | ICD-10-CM | POA: Diagnosis not present

## 2022-05-31 DIAGNOSIS — M25662 Stiffness of left knee, not elsewhere classified: Secondary | ICD-10-CM | POA: Diagnosis not present

## 2022-05-31 DIAGNOSIS — M6281 Muscle weakness (generalized): Secondary | ICD-10-CM | POA: Diagnosis not present

## 2022-05-31 DIAGNOSIS — M1712 Unilateral primary osteoarthritis, left knee: Secondary | ICD-10-CM | POA: Diagnosis not present

## 2022-05-31 DIAGNOSIS — R262 Difficulty in walking, not elsewhere classified: Secondary | ICD-10-CM | POA: Diagnosis not present

## 2022-05-31 DIAGNOSIS — Z96652 Presence of left artificial knee joint: Secondary | ICD-10-CM | POA: Diagnosis not present

## 2022-06-02 DIAGNOSIS — M1712 Unilateral primary osteoarthritis, left knee: Secondary | ICD-10-CM | POA: Diagnosis not present

## 2022-06-02 DIAGNOSIS — Z96652 Presence of left artificial knee joint: Secondary | ICD-10-CM | POA: Diagnosis not present

## 2022-06-02 DIAGNOSIS — M6281 Muscle weakness (generalized): Secondary | ICD-10-CM | POA: Diagnosis not present

## 2022-06-02 DIAGNOSIS — R262 Difficulty in walking, not elsewhere classified: Secondary | ICD-10-CM | POA: Diagnosis not present

## 2022-06-02 DIAGNOSIS — M25662 Stiffness of left knee, not elsewhere classified: Secondary | ICD-10-CM | POA: Diagnosis not present

## 2022-06-09 DIAGNOSIS — M1712 Unilateral primary osteoarthritis, left knee: Secondary | ICD-10-CM | POA: Diagnosis not present

## 2022-06-09 DIAGNOSIS — Z96652 Presence of left artificial knee joint: Secondary | ICD-10-CM | POA: Diagnosis not present

## 2022-06-09 DIAGNOSIS — M25662 Stiffness of left knee, not elsewhere classified: Secondary | ICD-10-CM | POA: Diagnosis not present

## 2022-06-09 DIAGNOSIS — R262 Difficulty in walking, not elsewhere classified: Secondary | ICD-10-CM | POA: Diagnosis not present

## 2022-06-09 DIAGNOSIS — M6281 Muscle weakness (generalized): Secondary | ICD-10-CM | POA: Diagnosis not present

## 2022-06-14 DIAGNOSIS — Z96652 Presence of left artificial knee joint: Secondary | ICD-10-CM | POA: Diagnosis not present

## 2022-06-14 DIAGNOSIS — R262 Difficulty in walking, not elsewhere classified: Secondary | ICD-10-CM | POA: Diagnosis not present

## 2022-06-14 DIAGNOSIS — M6281 Muscle weakness (generalized): Secondary | ICD-10-CM | POA: Diagnosis not present

## 2022-06-14 DIAGNOSIS — M25662 Stiffness of left knee, not elsewhere classified: Secondary | ICD-10-CM | POA: Diagnosis not present

## 2022-06-14 DIAGNOSIS — M1712 Unilateral primary osteoarthritis, left knee: Secondary | ICD-10-CM | POA: Diagnosis not present

## 2022-06-16 DIAGNOSIS — M25662 Stiffness of left knee, not elsewhere classified: Secondary | ICD-10-CM | POA: Diagnosis not present

## 2022-06-16 DIAGNOSIS — M1712 Unilateral primary osteoarthritis, left knee: Secondary | ICD-10-CM | POA: Diagnosis not present

## 2022-06-16 DIAGNOSIS — R262 Difficulty in walking, not elsewhere classified: Secondary | ICD-10-CM | POA: Diagnosis not present

## 2022-06-16 DIAGNOSIS — M6281 Muscle weakness (generalized): Secondary | ICD-10-CM | POA: Diagnosis not present

## 2022-06-16 DIAGNOSIS — Z96652 Presence of left artificial knee joint: Secondary | ICD-10-CM | POA: Diagnosis not present

## 2022-06-21 DIAGNOSIS — M1712 Unilateral primary osteoarthritis, left knee: Secondary | ICD-10-CM | POA: Diagnosis not present

## 2022-06-21 DIAGNOSIS — R262 Difficulty in walking, not elsewhere classified: Secondary | ICD-10-CM | POA: Diagnosis not present

## 2022-06-21 DIAGNOSIS — M25662 Stiffness of left knee, not elsewhere classified: Secondary | ICD-10-CM | POA: Diagnosis not present

## 2022-06-21 DIAGNOSIS — M6281 Muscle weakness (generalized): Secondary | ICD-10-CM | POA: Diagnosis not present

## 2022-06-21 DIAGNOSIS — Z96652 Presence of left artificial knee joint: Secondary | ICD-10-CM | POA: Diagnosis not present

## 2022-06-23 DIAGNOSIS — M6281 Muscle weakness (generalized): Secondary | ICD-10-CM | POA: Diagnosis not present

## 2022-06-23 DIAGNOSIS — M25552 Pain in left hip: Secondary | ICD-10-CM | POA: Diagnosis not present

## 2022-06-23 DIAGNOSIS — M1712 Unilateral primary osteoarthritis, left knee: Secondary | ICD-10-CM | POA: Diagnosis not present

## 2022-06-23 DIAGNOSIS — R262 Difficulty in walking, not elsewhere classified: Secondary | ICD-10-CM | POA: Diagnosis not present

## 2022-06-23 DIAGNOSIS — Z96652 Presence of left artificial knee joint: Secondary | ICD-10-CM | POA: Diagnosis not present

## 2022-06-23 DIAGNOSIS — M25662 Stiffness of left knee, not elsewhere classified: Secondary | ICD-10-CM | POA: Diagnosis not present

## 2022-06-28 DIAGNOSIS — R262 Difficulty in walking, not elsewhere classified: Secondary | ICD-10-CM | POA: Diagnosis not present

## 2022-06-28 DIAGNOSIS — M6281 Muscle weakness (generalized): Secondary | ICD-10-CM | POA: Diagnosis not present

## 2022-06-28 DIAGNOSIS — Z96652 Presence of left artificial knee joint: Secondary | ICD-10-CM | POA: Diagnosis not present

## 2022-06-28 DIAGNOSIS — M1712 Unilateral primary osteoarthritis, left knee: Secondary | ICD-10-CM | POA: Diagnosis not present

## 2022-06-28 DIAGNOSIS — M25662 Stiffness of left knee, not elsewhere classified: Secondary | ICD-10-CM | POA: Diagnosis not present

## 2022-07-20 DIAGNOSIS — M810 Age-related osteoporosis without current pathological fracture: Secondary | ICD-10-CM | POA: Diagnosis not present

## 2022-07-20 DIAGNOSIS — M25552 Pain in left hip: Secondary | ICD-10-CM | POA: Diagnosis not present

## 2022-07-20 DIAGNOSIS — Z792 Long term (current) use of antibiotics: Secondary | ICD-10-CM | POA: Diagnosis not present

## 2022-07-20 DIAGNOSIS — R030 Elevated blood-pressure reading, without diagnosis of hypertension: Secondary | ICD-10-CM | POA: Diagnosis not present

## 2022-07-20 DIAGNOSIS — E559 Vitamin D deficiency, unspecified: Secondary | ICD-10-CM | POA: Diagnosis not present

## 2022-07-20 DIAGNOSIS — E782 Mixed hyperlipidemia: Secondary | ICD-10-CM | POA: Diagnosis not present

## 2022-09-22 DIAGNOSIS — M810 Age-related osteoporosis without current pathological fracture: Secondary | ICD-10-CM | POA: Diagnosis not present

## 2022-09-22 DIAGNOSIS — M170 Bilateral primary osteoarthritis of knee: Secondary | ICD-10-CM | POA: Diagnosis not present

## 2022-09-22 DIAGNOSIS — M1712 Unilateral primary osteoarthritis, left knee: Secondary | ICD-10-CM | POA: Diagnosis not present

## 2022-10-14 DIAGNOSIS — M810 Age-related osteoporosis without current pathological fracture: Secondary | ICD-10-CM | POA: Diagnosis not present

## 2022-10-27 DIAGNOSIS — U071 COVID-19: Secondary | ICD-10-CM | POA: Diagnosis not present

## 2022-12-08 ENCOUNTER — Ambulatory Visit (HOSPITAL_COMMUNITY)
Admission: RE | Admit: 2022-12-08 | Discharge: 2022-12-08 | Disposition: A | Discharge status: Home / Self Care | Payer: Medicare Other | Source: Ambulatory Visit | Attending: Vascular Surgery | Admitting: Vascular Surgery

## 2022-12-08 ENCOUNTER — Other Ambulatory Visit (HOSPITAL_COMMUNITY): Payer: Self-pay | Admitting: Orthopedic Surgery

## 2022-12-08 DIAGNOSIS — M79605 Pain in left leg: Secondary | ICD-10-CM | POA: Diagnosis not present

## 2022-12-08 DIAGNOSIS — M7989 Other specified soft tissue disorders: Secondary | ICD-10-CM | POA: Diagnosis not present

## 2022-12-08 DIAGNOSIS — M25552 Pain in left hip: Secondary | ICD-10-CM | POA: Diagnosis not present

## 2022-12-08 DIAGNOSIS — M1712 Unilateral primary osteoarthritis, left knee: Secondary | ICD-10-CM | POA: Diagnosis not present

## 2022-12-12 DIAGNOSIS — M25562 Pain in left knee: Secondary | ICD-10-CM | POA: Diagnosis not present

## 2022-12-12 DIAGNOSIS — M25462 Effusion, left knee: Secondary | ICD-10-CM | POA: Diagnosis not present

## 2023-01-21 ENCOUNTER — Other Ambulatory Visit: Payer: Self-pay

## 2023-01-21 ENCOUNTER — Emergency Department (HOSPITAL_COMMUNITY): Payer: Medicare Other

## 2023-01-21 ENCOUNTER — Emergency Department (HOSPITAL_COMMUNITY)
Admission: EM | Admit: 2023-01-21 | Discharge: 2023-01-21 | Disposition: A | Discharge status: Home / Self Care | Payer: Medicare Other | Source: Home / Self Care | Attending: Emergency Medicine | Admitting: Emergency Medicine

## 2023-01-21 ENCOUNTER — Encounter (HOSPITAL_COMMUNITY): Payer: Self-pay | Admitting: *Deleted

## 2023-01-21 DIAGNOSIS — R079 Chest pain, unspecified: Secondary | ICD-10-CM | POA: Diagnosis not present

## 2023-01-21 DIAGNOSIS — Z91040 Latex allergy status: Secondary | ICD-10-CM | POA: Insufficient documentation

## 2023-01-21 DIAGNOSIS — R0789 Other chest pain: Secondary | ICD-10-CM | POA: Diagnosis not present

## 2023-01-21 DIAGNOSIS — I700 Atherosclerosis of aorta: Secondary | ICD-10-CM | POA: Diagnosis not present

## 2023-01-21 LAB — BASIC METABOLIC PANEL
Anion gap: 11 (ref 5–15)
BUN: 18 mg/dL (ref 8–23)
CO2: 26 mmol/L (ref 22–32)
Calcium: 8.7 mg/dL — ABNORMAL LOW (ref 8.9–10.3)
Chloride: 100 mmol/L (ref 98–111)
Creatinine, Ser: 0.96 mg/dL (ref 0.44–1.00)
GFR, Estimated: >60 mL/min (ref >60)
Glucose, Bld: 132 mg/dL — ABNORMAL HIGH (ref 70–99)
Potassium: 3.7 mmol/L (ref 3.5–5.1)
Sodium: 137 mmol/L (ref 135–145)

## 2023-01-21 LAB — CBC
HCT: 41 % (ref 36.0–46.0)
Hemoglobin: 13.9 g/dL (ref 12.0–15.0)
MCH: 32.6 pg (ref 26.0–34.0)
MCHC: 33.9 g/dL (ref 30.0–36.0)
MCV: 96.2 fL (ref 80.0–100.0)
Platelets: 250 10*3/uL (ref 150–400)
RBC: 4.26 MIL/uL (ref 3.87–5.11)
RDW: 11.9 % (ref 11.5–15.5)
WBC: 5.3 10*3/uL (ref 4.0–10.5)
nRBC: 0 % (ref 0.0–0.2)

## 2023-01-21 LAB — TROPONIN I (HIGH SENSITIVITY)
Troponin I (High Sensitivity): 4 ng/L (ref <18)
Troponin I (High Sensitivity): 4 ng/L (ref <18)

## 2023-01-21 MED ORDER — IOHEXOL 350 MG/ML SOLN
75.0000 mL | Freq: Once | INTRAVENOUS | Status: AC | PRN
  Administered 2023-01-21: 75 mL via INTRAVENOUS

## 2023-01-21 NOTE — ED Provider Notes (Signed)
[facility] EMERGENCY DEPARTMENT AT [facility] Provider Note   CSN: [id number] Arrival date & time: 01/21/23  0117     History  Chief Complaint  Patient presents with   Chest Pain    Jackie Lee is a 67 y.o. female.  The history is provided by the patient and medical records.  Chest Pain  67 year old female with history of acid reflux, hypercholesterolemia, presenting to the ED with chest pain.  This woke her from sleep around midnight.  Pain is sharp and stabbing in her right lower rib cage with some radiation to her back.  She did have some shortness of breath initially but attributed this to anxiety, seems to have resolved now.  She has not had any nausea or vomiting.  No fever or chills.  No cough or upper respiratory symptoms.  No prior cardiac history.  No history of DVT or PE.  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  States only time she ever remembers having pain like this in the past was due to shingles, however no recent rash.  Home Medications Prior to Admission medications   Medication Sig Start Date End Date Taking? Authorizing Provider  ezetimibe (ZETIA) 10 MG tablet  10/28/19   [provider]  fenofibrate 160 MG tablet  10/28/19   [provider]  meloxicam (MOBIC) 15 MG tablet Take 15 mg by mouth daily. 02/10/20   [provider]      Allergies    Betadine [povidone iodine], Flexeril [cyclobenzaprine], Latex, and Penicillins    Review of Systems   Review of Systems  Cardiovascular:  Positive for chest pain.  All other systems reviewed and are negative.   Physical Exam Updated Vital Signs BP 134/68   Pulse 81   Temp 98.3 F (36.8 C) (Oral)   Resp 17   Ht 5\' 5"  (1.651 m)   Wt 71 kg   SpO2 100%   BMI 26.05 kg/m   Physical Exam Vitals and nursing note reviewed.  Constitutional:      Appearance: She is well-developed.  HENT:     Head: Normocephalic and atraumatic.  Eyes:     Conjunctiva/sclera:  Conjunctivae normal.     Pupils: Pupils are equal, round, and reactive to light.  Cardiovascular:     Rate and Rhythm: Normal rate and regular rhythm.     Heart sounds: Normal heart sounds.  Pulmonary:     Effort: Pulmonary effort is normal.     Breath sounds: Normal breath sounds.  Chest:     Comments: No rash or other abnormal skin findings to the left posterior chest or thoracic back/flank Abdominal:     General: Bowel sounds are normal.     Palpations: Abdomen is soft.  Musculoskeletal:        General: Normal range of motion.     Cervical back: Normal range of motion.  Skin:    General: Skin is warm and dry.  Neurological:     Mental Status: She is alert and oriented to person, place, and time.     ED Results / Procedures / Treatments   Labs (all labs ordered are listed, but only abnormal results are displayed) Labs Reviewed  BASIC METABOLIC PANEL - Abnormal; Notable for the following components:      Result Value   Glucose, Bld 132 (*)    Calcium 8.7 (*)    All other components within normal limits  CBC  TROPONIN I (HIGH SENSITIVITY)  TROPONIN I (HIGH SENSITIVITY)    EKG None  Radiology CT Angio Chest PE W and/or Wo Contrast  Result Date: 01/21/2023 CLINICAL DATA:  Pulmonary embolism (PE) suspected, low to intermediate prob, positive D-dimer. Chest pain EXAM: CT ANGIOGRAPHY CHEST WITH CONTRAST TECHNIQUE: Multidetector CT imaging of the chest was performed using the standard protocol during bolus administration of intravenous contrast. Multiplanar CT image reconstructions and MIPs were obtained to evaluate the vascular anatomy. RADIATION DOSE REDUCTION: This exam was performed according to the departmental dose-optimization program which includes automated exposure control, adjustment of the mA and/or kV according to patient size and/or use of iterative reconstruction technique. CONTRAST:  75mL OMNIPAQUE IOHEXOL 350 MG/ML SOLN COMPARISON:  None Available. FINDINGS:  Cardiovascular: Heart is normal size. Aorta is normal caliber. Scattered coronary artery and aortic calcifications. No filling defects in the pulmonary arteries to suggest pulmonary emboli. Mediastinum/Nodes: No mediastinal, hilar, or axillary adenopathy. Trachea and esophagus are unremarkable. Thyroid unremarkable. Lungs/Pleura: No confluent airspace opacities, effusions or pulmonary nodules. Upper Abdomen: No acute findings Musculoskeletal: Chest wall soft tissues are unremarkable. No acute bony abnormality. Review of the MIP images confirms the above findings. IMPRESSION: No evidence of pulmonary embolus. Scattered coronary artery disease. No acute cardiopulmonary disease. Aortic Atherosclerosis (ICD10-I70.0). Electronically Signed   By: Charlett Nose M.D.   On: 01/21/2023 03:45   DG Chest 2 View  Result Date: 01/21/2023 CLINICAL DATA:  Chest pain.  Sudden onset of left central pain. EXAM: CHEST - 2 VIEW COMPARISON:  11/22/2011 FINDINGS: The cardiomediastinal contours are normal. The lungs are clear. Pulmonary vasculature is normal. No consolidation, pleural effusion, or pneumothorax. No acute osseous abnormalities are seen. IMPRESSION: Negative radiographs of the chest. Electronically Signed   By: Narda Rutherford M.D.   On: 01/21/2023 02:21    Procedures Procedures    Medications Ordered in ED Medications  iohexol (OMNIPAQUE) 350 MG/ML injection 75 mL (75 mLs Intravenous Contrast Given 01/21/23 0341)    ED Course/ Medical Decision Making/ A&P                                 Medical Decision Making Amount and/or Complexity of Data Reviewed Labs: ordered. Radiology: ordered and independent interpretation performed. ECG/medicine tests: ordered and independent interpretation performed.  Risk Prescription drug management.   67 year old female presenting to the ED with chest pain.  Woke her from sleep around midnight.  Pain isolated to left lateral chest with some radiation into the back.   She is afebrile and nontoxic.  Her lungs are clear without wheezes or rhonchi.  Vitals are stable on room air.  History of similar pain in the past with shingles, however no rash present currently.  No other skin abnormalities.  EKG is nonischemic.  Labs are reassuring, troponin negative.  Chest x-ray is clear.  Patient does describe somewhat of a pleuritic pain, especially worse with deep breathing.  She has had 2 knee replacements in the past year.  No history of DVT or PE.  Given this, will order CTA to assess for PE.  CTA obtained, no acute findings.  She does have some coronary calcifications.  Repeat trop is negative.  Unclear etiology of her pain but no findings to suggest ACS, PE, dissection, or other acute cardiac event.  Consider early shingles without rash present yet.  Given she does have calcifications seen on CT, may need to consider OP cardiac CT for risk stratification.  She  has FU scheduled with PCP on 01/26/23 which is appropriate time frame.  She can return here for any new/acute changes.  Final Clinical Impression(s) / ED Diagnoses Final diagnoses:  Chest pain, unspecified type    Rx / DC Orders ED Discharge Orders     None         Garlon Hatchet, PA-C 01/21/23 0549    Marily Memos, MD 01/21/23 [phone]

## 2023-01-21 NOTE — Discharge Instructions (Addendum)
Cardiac work-up today was reassuring. There were some coronary calcifications seen on your CT so may want cardiac CT for risk stratification in the future. Monitor to see if rash develops, is so then may have been early shingles. Follow-up with your doctor on 01/26/23 as scheduled.   Return here for new concerns.

## 2023-01-26 DIAGNOSIS — Z0000 Encounter for general adult medical examination without abnormal findings: Secondary | ICD-10-CM | POA: Diagnosis not present

## 2023-01-26 DIAGNOSIS — Z23 Encounter for immunization: Secondary | ICD-10-CM | POA: Diagnosis not present

## 2023-01-26 DIAGNOSIS — E559 Vitamin D deficiency, unspecified: Secondary | ICD-10-CM | POA: Diagnosis not present

## 2023-01-26 DIAGNOSIS — E782 Mixed hyperlipidemia: Secondary | ICD-10-CM | POA: Diagnosis not present

## 2023-01-26 DIAGNOSIS — R079 Chest pain, unspecified: Secondary | ICD-10-CM | POA: Diagnosis not present

## 2023-01-26 DIAGNOSIS — Z1231 Encounter for screening mammogram for malignant neoplasm of breast: Secondary | ICD-10-CM | POA: Diagnosis not present

## 2023-04-17 DIAGNOSIS — M810 Age-related osteoporosis without current pathological fracture: Secondary | ICD-10-CM | POA: Diagnosis not present

## 2023-04-25 DIAGNOSIS — H2513 Age-related nuclear cataract, bilateral: Secondary | ICD-10-CM | POA: Diagnosis not present

## 2023-07-26 DIAGNOSIS — E782 Mixed hyperlipidemia: Secondary | ICD-10-CM | POA: Diagnosis not present

## 2023-07-26 DIAGNOSIS — R7303 Prediabetes: Secondary | ICD-10-CM | POA: Diagnosis not present

## 2023-07-26 DIAGNOSIS — Z91018 Allergy to other foods: Secondary | ICD-10-CM | POA: Diagnosis not present

## 2023-07-26 DIAGNOSIS — E559 Vitamin D deficiency, unspecified: Secondary | ICD-10-CM | POA: Diagnosis not present

## 2023-07-26 DIAGNOSIS — M810 Age-related osteoporosis without current pathological fracture: Secondary | ICD-10-CM | POA: Diagnosis not present

## 2023-08-21 DIAGNOSIS — Z91014 Allergy to mammalian meats: Secondary | ICD-10-CM | POA: Diagnosis not present

## 2023-08-21 DIAGNOSIS — J3089 Other allergic rhinitis: Secondary | ICD-10-CM | POA: Diagnosis not present

## 2023-08-21 DIAGNOSIS — J301 Allergic rhinitis due to pollen: Secondary | ICD-10-CM | POA: Diagnosis not present

## 2023-08-21 DIAGNOSIS — J3081 Allergic rhinitis due to animal (cat) (dog) hair and dander: Secondary | ICD-10-CM | POA: Diagnosis not present

## 2023-09-12 DIAGNOSIS — R1031 Right lower quadrant pain: Secondary | ICD-10-CM | POA: Diagnosis not present

## 2023-09-12 DIAGNOSIS — R109 Unspecified abdominal pain: Secondary | ICD-10-CM | POA: Diagnosis not present

## 2023-10-23 DIAGNOSIS — M8588 Other specified disorders of bone density and structure, other site: Secondary | ICD-10-CM | POA: Diagnosis not present

## 2024-01-31 DIAGNOSIS — Z1231 Encounter for screening mammogram for malignant neoplasm of breast: Secondary | ICD-10-CM | POA: Diagnosis not present

## 2024-01-31 DIAGNOSIS — M810 Age-related osteoporosis without current pathological fracture: Secondary | ICD-10-CM | POA: Diagnosis not present

## 2024-01-31 DIAGNOSIS — R7303 Prediabetes: Secondary | ICD-10-CM | POA: Diagnosis not present

## 2024-01-31 DIAGNOSIS — Z23 Encounter for immunization: Secondary | ICD-10-CM | POA: Diagnosis not present

## 2024-01-31 DIAGNOSIS — Z0000 Encounter for general adult medical examination without abnormal findings: Secondary | ICD-10-CM | POA: Diagnosis not present

## 2024-01-31 DIAGNOSIS — E782 Mixed hyperlipidemia: Secondary | ICD-10-CM | POA: Diagnosis not present

## 2024-01-31 DIAGNOSIS — T466X5A Adverse effect of antihyperlipidemic and antiarteriosclerotic drugs, initial encounter: Secondary | ICD-10-CM | POA: Diagnosis not present

## 2024-01-31 DIAGNOSIS — E559 Vitamin D deficiency, unspecified: Secondary | ICD-10-CM | POA: Diagnosis not present

## 2024-02-28 DIAGNOSIS — M810 Age-related osteoporosis without current pathological fracture: Secondary | ICD-10-CM | POA: Diagnosis not present
